# Patient Record
Sex: Male | Born: 1980
Health system: Southern US, Community
[De-identification: ages and names within clinical notes are randomized; demographics above are authoritative.]

## PROBLEM LIST (undated history)

## (undated) DIAGNOSIS — E785 Hyperlipidemia, unspecified: Secondary | ICD-10-CM

## (undated) DIAGNOSIS — I10 Essential (primary) hypertension: Secondary | ICD-10-CM

## (undated) DIAGNOSIS — E119 Type 2 diabetes mellitus without complications: Secondary | ICD-10-CM

## (undated) DIAGNOSIS — Z720 Tobacco use: Secondary | ICD-10-CM

## (undated) HISTORY — DX: Hyperlipidemia, unspecified: E78.5

## (undated) HISTORY — DX: Tobacco use: Z72.0

## (undated) HISTORY — DX: Type 2 diabetes mellitus without complications: E11.9

---

## 2015-07-21 ENCOUNTER — Encounter (HOSPITAL_COMMUNITY): Payer: Self-pay

## 2015-07-21 ENCOUNTER — Emergency Department (HOSPITAL_COMMUNITY)
Admission: EM | Admit: 2015-07-21 | Discharge: 2015-07-21 | Disposition: A | Discharge status: Home / Self Care | Payer: PRIVATE HEALTH INSURANCE | Attending: Emergency Medicine | Admitting: Emergency Medicine

## 2015-07-21 DIAGNOSIS — M5431 Sciatica, right side: Secondary | ICD-10-CM | POA: Diagnosis not present

## 2015-07-21 DIAGNOSIS — M545 Low back pain: Secondary | ICD-10-CM | POA: Diagnosis present

## 2015-07-21 DIAGNOSIS — I10 Essential (primary) hypertension: Secondary | ICD-10-CM | POA: Diagnosis not present

## 2015-07-21 HISTORY — DX: Essential (primary) hypertension: I10

## 2015-07-21 MED ORDER — METHOCARBAMOL 500 MG PO TABS
500.0000 mg | ORAL_TABLET | Freq: Two times a day (BID) | ORAL | Status: DC
Start: 2015-07-21 — End: 2015-10-02

## 2015-07-21 MED ORDER — METHYLPREDNISOLONE SODIUM SUCC 125 MG IJ SOLR
125.0000 mg | Freq: Once | INTRAMUSCULAR | Status: AC
  Administered 2015-07-21: 125 mg via INTRAMUSCULAR
  Filled 2015-07-21: qty 2

## 2015-07-21 MED ORDER — DICLOFENAC SODIUM 75 MG PO TBEC: 75.0000 mg | DELAYED_RELEASE_TABLET | Freq: Two times a day (BID) | ORAL | Status: DC

## 2015-07-21 MED ORDER — KETOROLAC TROMETHAMINE 60 MG/2ML IM SOLN
60.0000 mg | Freq: Once | INTRAMUSCULAR | Status: AC
  Administered 2015-07-21: 60 mg via INTRAMUSCULAR
  Filled 2015-07-21: qty 2

## 2015-07-21 MED ORDER — PREDNISONE 10 MG PO TABS: ORAL_TABLET | ORAL | Status: DC

## 2015-07-21 NOTE — ED Provider Notes (Signed)
CSN: [id number]     Arrival date & time 07/21/15  1038 History   First MD Initiated Contact with Patient 07/21/15 1120     Chief Complaint  Patient presents with  . Back Pain     (Consider location/radiation/quality/duration/timing/severity/associated sxs/prior Treatment) Patient is a 34 y.o. male presenting with back pain. The history is provided by the patient. No language interpreter was used.  Back Pain Location:  Lumbar spine Quality:  Aching Radiates to:  Does not radiate Pain severity:  Moderate Onset quality:  Gradual Duration:  3 days Timing:  Constant Progression:  Worsening Chronicity:  New Context: not recent illness   Relieved by:  None tried Worsened by:  Nothing tried Ineffective treatments:  None tried Associated symptoms: leg pain     Past Medical History  Diagnosis Date  . Hypertension    History reviewed. No pertinent past surgical history. History reviewed. No pertinent family history. Social History  Substance Use Topics  . Smoking status: Current Every Day Smoker  . Smokeless tobacco: None  . Alcohol Use: Yes     Comment: social    Review of Systems  Musculoskeletal: Positive for back pain.  All other systems reviewed and are negative.     Allergies  Review of patient's allergies indicates no known allergies.  Home Medications   Prior to Admission medications   Medication Sig Start Date End Date Taking? Authorizing Provider  diclofenac (VOLTAREN) 75 MG EC tablet Take 1 tablet (75 mg total) by mouth 2 (two) times daily. 07/21/15   Elson Areas, PA-C  methocarbamol (ROBAXIN) 500 MG tablet Take 1 tablet (500 mg total) by mouth 2 (two) times daily. 07/21/15   Elson Areas, PA-C  predniSONE (DELTASONE) 10 MG tablet 6,5,4,3,2,1 taper 07/21/15   Elson Areas, PA-C   BP 149/87 mmHg  Pulse 70  Temp(Src) 98.1 F (36.7 C) (Oral)  Resp 16  SpO2 100% Physical Exam  Constitutional: He is oriented to person, place, and time. He appears  well-developed and well-nourished.  HENT:  Head: Normocephalic.  Right Ear: External ear normal.  Left Ear: External ear normal.  Nose: Nose normal.  Mouth/Throat: Oropharynx is clear and moist.  Eyes: EOM are normal. Pupils are equal, round, and reactive to light.  Neck: Normal range of motion.  Cardiovascular: Normal rate, regular rhythm and normal heart sounds.   Pulmonary/Chest: Effort normal and breath sounds normal.  Abdominal: Soft. He exhibits no distension.  Musculoskeletal: Normal range of motion.  Neurological: He is alert and oriented to person, place, and time.  Skin: Skin is warm.  Psychiatric: He has a normal mood and affect.  Nursing note and vitals reviewed.   ED Course  Procedures (including critical care time) Labs Review Labs Reviewed - No data to display  Imaging Review No results found. I have personally reviewed and evaluated these images and lab results as part of my medical decision-making.   EKG Interpretation None      MDM  Pt given solumedrol and torodol   Final diagnoses:  Sciatica, right    voltaren Robaxin Prednisone Wellness referral and [facility]     Elson Areas, PA-C 07/21/15 1203  Nelva Nay, MD 07/30/15 [phone]

## 2015-07-21 NOTE — ED Notes (Signed)
Per pt, started having right lower back pain radiating down leg.  Does not recall injury.  Pain increased with position changes.  Difficulty sitting.  Tylenol not working

## 2015-07-21 NOTE — Discharge Instructions (Signed)
Sciatica Sciatica is pain, weakness, numbness, or tingling along the path of the sciatic nerve. The nerve starts in the lower back and runs down the back of each leg. The nerve controls the muscles in the lower leg and in the back of the knee, while also providing sensation to the back of the thigh, lower leg, and the sole of your foot. Sciatica is a symptom of another medical condition. For instance, nerve damage or certain conditions, such as a herniated disk or bone spur on the spine, pinch or put pressure on the sciatic nerve. This causes the pain, weakness, or other sensations normally associated with sciatica. Generally, sciatica only affects one side of the body. CAUSES   Herniated or slipped disc.  Degenerative disk disease.  A pain disorder involving the narrow muscle in the buttocks (piriformis syndrome).  Pelvic injury or fracture.  Pregnancy.  Tumor (rare). SYMPTOMS  Symptoms can vary from mild to very severe. The symptoms usually travel from the low back to the buttocks and down the back of the leg. Symptoms can include:  Mild tingling or dull aches in the lower back, leg, or hip.  Numbness in the back of the calf or sole of the foot.  Burning sensations in the lower back, leg, or hip.  Sharp pains in the lower back, leg, or hip.  Leg weakness.  Severe back pain inhibiting movement. These symptoms may get worse with coughing, sneezing, laughing, or prolonged sitting or standing. Also, being overweight may worsen symptoms. DIAGNOSIS  Your caregiver will perform a physical exam to look for common symptoms of sciatica. He or she may ask you to do certain movements or activities that would trigger sciatic nerve pain. Other tests may be performed to find the cause of the sciatica. These may include:  Blood tests.  X-rays.  Imaging tests, such as an MRI or CT scan. TREATMENT  Treatment is directed at the cause of the sciatic pain. Sometimes, treatment is not necessary  and the pain and discomfort goes away on its own. If treatment is needed, your caregiver may suggest:  Over-the-counter medicines to relieve pain.  Prescription medicines, such as anti-inflammatory medicine, muscle relaxants, or narcotics.  Applying heat or ice to the painful area.  Steroid injections to lessen pain, irritation, and inflammation around the nerve.  Reducing activity during periods of pain.  Exercising and stretching to strengthen your abdomen and improve flexibility of your spine. Your caregiver may suggest losing weight if the extra weight makes the back pain worse.  Physical therapy.  Surgery to eliminate what is pressing or pinching the nerve, such as a bone spur or part of a herniated disk. HOME CARE INSTRUCTIONS   Only take over-the-counter or prescription medicines for pain or discomfort as directed by your caregiver.  Apply ice to the affected area for 20 minutes, 3-4 times a day for the first 48-72 hours. Then try heat in the same way.  Exercise, stretch, or perform your usual activities if these do not aggravate your pain.  Attend physical therapy sessions as directed by your caregiver.  Keep all follow-up appointments as directed by your caregiver.  Do not wear high heels or shoes that do not provide proper support.  Check your mattress to see if it is too soft. A firm mattress may lessen your pain and discomfort. SEEK IMMEDIATE MEDICAL CARE IF:   You lose control of your bowel or bladder (incontinence).  You have increasing weakness in the lower back, pelvis, buttocks,   or legs.  You have redness or swelling of your back.  You have a burning sensation when you urinate.  You have pain that gets worse when you lie down or awakens you at night.  Your pain is worse than you have experienced in the past.  Your pain is lasting longer than 4 weeks.  You are suddenly losing weight without reason. MAKE SURE YOU:  Understand these  instructions.  Will watch your condition.  Will get help right away if you are not doing well or get worse. Document Released: 11/03/2001 Document Revised: 05/10/2012 Document Reviewed: 03/20/2012 [employer] Patient Information 2015 [employer]. This information is not intended to replace advice given to you by your health care provider. Make sure you discuss any questions you have with your health care provider.  

## 2015-10-01 DIAGNOSIS — Z720 Tobacco use: Secondary | ICD-10-CM | POA: Insufficient documentation

## 2015-10-01 DIAGNOSIS — G8929 Other chronic pain: Secondary | ICD-10-CM | POA: Insufficient documentation

## 2015-10-01 DIAGNOSIS — Z7952 Long term (current) use of systemic steroids: Secondary | ICD-10-CM | POA: Insufficient documentation

## 2015-10-01 DIAGNOSIS — M5431 Sciatica, right side: Secondary | ICD-10-CM | POA: Insufficient documentation

## 2015-10-01 DIAGNOSIS — I10 Essential (primary) hypertension: Secondary | ICD-10-CM | POA: Insufficient documentation

## 2015-10-02 ENCOUNTER — Emergency Department (HOSPITAL_COMMUNITY)
Admission: EM | Admit: 2015-10-02 | Discharge: 2015-10-02 | Disposition: A | Discharge status: Home / Self Care | Payer: PRIVATE HEALTH INSURANCE | Attending: Emergency Medicine | Admitting: Emergency Medicine

## 2015-10-02 ENCOUNTER — Encounter (HOSPITAL_COMMUNITY): Payer: Self-pay | Admitting: Emergency Medicine

## 2015-10-02 DIAGNOSIS — M5431 Sciatica, right side: Secondary | ICD-10-CM

## 2015-10-02 MED ORDER — TRAMADOL HCL 50 MG PO TABS: 50.0000 mg | ORAL_TABLET | Freq: Four times a day (QID) | ORAL | Status: DC | PRN

## 2015-10-02 MED ORDER — KETOROLAC TROMETHAMINE 60 MG/2ML IM SOLN
60.0000 mg | Freq: Once | INTRAMUSCULAR | Status: AC
  Administered 2015-10-02: 60 mg via INTRAMUSCULAR
  Filled 2015-10-02: qty 2

## 2015-10-02 MED ORDER — DICLOFENAC SODIUM 75 MG PO TBEC: 75.0000 mg | DELAYED_RELEASE_TABLET | Freq: Two times a day (BID) | ORAL | Status: DC

## 2015-10-02 MED ORDER — METHOCARBAMOL 500 MG PO TABS: 500.0000 mg | ORAL_TABLET | Freq: Two times a day (BID) | ORAL | Status: DC

## 2015-10-02 NOTE — ED Provider Notes (Signed)
CSN: [id number]     Arrival date & time 10/01/15  2353 History   First MD Initiated Contact with Patient 10/02/15 0015     Chief Complaint  Patient presents with  . Back Pain     (Consider location/radiation/quality/duration/timing/severity/associated sxs/prior Treatment) HPI   Craig Rowland is a 34 y.o. male  PCP: No PCP Per Patient  Blood pressure 141/93, pulse 73, temperature 97.8 F (36.6 C), temperature source Oral, resp. rate 16, SpO2 99 %.  SIGNIFICANT PMH: hypertension and sciatica CHIEF COMPLAINT: low back pain and sciatica  When: it has been bothering him off and on for a long time but the past 4 days it has been much more severe on the right side only. Mechanism: he works for [employer] but denies a specific inciting factor Chronicity: chronic Location: right low back and hip Radiation: down towards his right thigh Quality and severity: sharp, aching, pressure Treatments tried: OTC medications Alleviating factors: None Worsening factors: None Associated Symptoms: stiffness of the muscles and pain, some improvements with stretching Risk Factors: lifting and bending, on his feet a lot at [employer].  Negative ROS: Confusion, diaphoresis, fever, headache, weakness (general or focal), change of vision,  neck pain, dysphagia, aphagia, chest pain, shortness of breath,  , abdominal pains, nausea, vomiting, diarrhea, lower extremity swelling, rash.   Past Medical History  Diagnosis Date  . Hypertension    History reviewed. No pertinent past surgical history. History reviewed. No pertinent family history. Social History  Substance Use Topics  . Smoking status: Current Every Day Smoker  . Smokeless tobacco: None  . Alcohol Use: Yes     Comment: social    Review of Systems  ROS: See HPI Constitutional: no fever  Eyes: no drainage  ENT: no runny nose  Cardiovascular: no chest pain  Resp: no SOB  GI: no vomiting GU: no dysuria Integumentary: no rash  Allergy: no  hives  Musculoskeletal: no leg swelling  Neurological: no slurred speech ROS otherwise negative   Allergies  Review of patient's allergies indicates no known allergies.  Home Medications   Prior to Admission medications   Medication Sig Start Date End Date Taking? Authorizing Provider  diclofenac (VOLTAREN) 75 MG EC tablet Take 1 tablet (75 mg total) by mouth 2 (two) times daily. 10/02/15   May Manrique Neva Seat, PA-C  methocarbamol (ROBAXIN) 500 MG tablet Take 1 tablet (500 mg total) by mouth 2 (two) times daily. 10/02/15   Chrysa Rampy Neva Seat, PA-C  predniSONE (DELTASONE) 10 MG tablet 6,5,4,3,2,1 taper 07/21/15   Elson Areas, PA-C  traMADol (ULTRAM) 50 MG tablet Take 1 tablet (50 mg total) by mouth every 6 (six) hours as needed. 10/02/15   Adlee Paar Neva Seat, PA-C   BP 141/93 mmHg  Pulse 73  Temp(Src) 97.8 F (36.6 C) (Oral)  Resp 16  SpO2 99% Physical Exam  Constitutional: He appears well-developed and well-nourished. No distress.  HENT:  Head: Normocephalic and atraumatic.  Eyes: Pupils are equal, round, and reactive to light.  Neck: Normal range of motion. Neck supple.  Cardiovascular: Normal rate and regular rhythm.   Pulmonary/Chest: Effort normal.  Abdominal: Soft.  Musculoskeletal:  Symmetrical and physiologic strength to bilateral lower extremities.  Neurosensory function adequate to both legs Skin color is normal. Skin is warm and moist.  No step off deformity appreciated and no midline bony tenderness.  Can ambulate with some discomfort.  No crepitus, laceration, effusion, induration, lesions Pedal pulses are symmetrical and palpable bilaterally  + tenderness to right hip and  paraspinal lumbar muscle on palpation  Neurological: He is alert.  Skin: Skin is warm and dry.  Nursing note and vitals reviewed.   ED Course  Procedures (including critical care time) Labs Review Labs Reviewed - No data to display  Imaging Review No results found. I have personally reviewed  and evaluated these images and lab results as part of my medical decision-making.   EKG Interpretation None      MDM   Final diagnoses:  Sciatica of right side    34 y.o.Craig Rowland's  with back pain.   No neurological deficits and normal neuro exam. No loss of bowel or bladder control. No concern for cauda equina at this time base on HPI and physical exam findings. No fever, night sweats, weight loss, h/o cancer, IVDU. The patient can walk with some discomfort.   Patient Plan 1. Medications: NSAIDs and/or muscle relaxer. Cont usual home medications unless otherwise directed. 2. Treatment: rest, drink plenty of fluids, gentle stretching as discussed, alternate ice and heat  3. Follow Up: Please followup with your primary doctor for discussion of your diagnoses and further evaluation after today's visit; if you do not have a primary care doctor use the resource guide provided to find one  Advised to follow-up with the orthopedist if symptoms do not start to resolve in the next 2-3 days. If develop loss of bowel or urinary control return to the ED as soon as possible for further evaluation. To take the medications as prescribed as they can cause harm if not taken appropriately.   Vital signs are stable at discharge. Filed Vitals:   10/02/15 0014  BP: 141/93  Pulse: 73  Temp: 97.8 F (36.6 C)  Resp: 16    Patient/guardian has voiced understanding and agreed to follow-up with the PCP or specialist.         Marlon Peliffany Eula Jaster, PA-C 10/02/15 0445  Gilda Creasehristopher J Pollina, MD 10/02/15 [phone]

## 2015-10-02 NOTE — ED Notes (Signed)
Bed: WA04 Expected date:  Expected time:  Means of arrival:  Comments: 

## 2015-10-02 NOTE — ED Notes (Signed)
Pt states he has a hx of sciatica and now has R sided lower back pain that feels similar. Alert and oriented.

## 2015-10-02 NOTE — Discharge Instructions (Signed)

## 2016-01-07 ENCOUNTER — Emergency Department (HOSPITAL_COMMUNITY)
Admission: EM | Admit: 2016-01-07 | Discharge: 2016-01-07 | Disposition: A | Discharge status: Home / Self Care | Payer: PRIVATE HEALTH INSURANCE | Attending: Physician Assistant | Admitting: Physician Assistant

## 2016-01-07 ENCOUNTER — Encounter (HOSPITAL_COMMUNITY): Payer: Self-pay

## 2016-01-07 DIAGNOSIS — R05 Cough: Secondary | ICD-10-CM | POA: Insufficient documentation

## 2016-01-07 DIAGNOSIS — Y9389 Activity, other specified: Secondary | ICD-10-CM | POA: Insufficient documentation

## 2016-01-07 DIAGNOSIS — T148 Other injury of unspecified body region: Secondary | ICD-10-CM | POA: Insufficient documentation

## 2016-01-07 DIAGNOSIS — I10 Essential (primary) hypertension: Secondary | ICD-10-CM | POA: Insufficient documentation

## 2016-01-07 DIAGNOSIS — M545 Low back pain: Secondary | ICD-10-CM | POA: Insufficient documentation

## 2016-01-07 DIAGNOSIS — X58XXXA Exposure to other specified factors, initial encounter: Secondary | ICD-10-CM | POA: Insufficient documentation

## 2016-01-07 DIAGNOSIS — F17210 Nicotine dependence, cigarettes, uncomplicated: Secondary | ICD-10-CM | POA: Insufficient documentation

## 2016-01-07 DIAGNOSIS — R197 Diarrhea, unspecified: Secondary | ICD-10-CM | POA: Insufficient documentation

## 2016-01-07 DIAGNOSIS — Y998 Other external cause status: Secondary | ICD-10-CM | POA: Insufficient documentation

## 2016-01-07 DIAGNOSIS — G8929 Other chronic pain: Secondary | ICD-10-CM | POA: Insufficient documentation

## 2016-01-07 DIAGNOSIS — T148XXA Other injury of unspecified body region, initial encounter: Secondary | ICD-10-CM

## 2016-01-07 DIAGNOSIS — Y9289 Other specified places as the place of occurrence of the external cause: Secondary | ICD-10-CM | POA: Insufficient documentation

## 2016-01-07 LAB — URINALYSIS, ROUTINE W REFLEX MICROSCOPIC
BILIRUBIN URINE: NEGATIVE
GLUCOSE, UA: NEGATIVE mg/dL
Hgb urine dipstick: NEGATIVE
Ketones, ur: NEGATIVE mg/dL
Leukocytes, UA: NEGATIVE
NITRITE: NEGATIVE
PH: 5 (ref 5.0–8.0)
Protein, ur: NEGATIVE mg/dL
SPECIFIC GRAVITY, URINE: 1.027 (ref 1.005–1.030)

## 2016-01-07 LAB — CBC WITH DIFFERENTIAL/PLATELET
Basophils Absolute: 0 10*3/uL (ref 0.0–0.1)
Basophils Relative: 0 %
Eosinophils Absolute: 0.5 10*3/uL (ref 0.0–0.7)
Eosinophils Relative: 5 %
HEMATOCRIT: 41.1 % (ref 39.0–52.0)
Hemoglobin: 14.2 g/dL (ref 13.0–17.0)
LYMPHS ABS: 3.7 10*3/uL (ref 0.7–4.0)
Lymphocytes Relative: 35 %
MCH: 31.8 pg (ref 26.0–34.0)
MCHC: 34.5 g/dL (ref 30.0–36.0)
MCV: 92.2 fL (ref 78.0–100.0)
MONO ABS: 0.6 10*3/uL (ref 0.1–1.0)
MONOS PCT: 5 %
NEUTROS PCT: 55 %
Neutro Abs: 5.9 10*3/uL (ref 1.7–7.7)
Platelets: 327 10*3/uL (ref 150–400)
RBC: 4.46 MIL/uL (ref 4.22–5.81)
RDW: 12.3 % (ref 11.5–15.5)
WBC: 10.7 10*3/uL — ABNORMAL HIGH (ref 4.0–10.5)

## 2016-01-07 LAB — BASIC METABOLIC PANEL
Anion gap: 13 (ref 5–15)
BUN: 14 mg/dL (ref 6–20)
CHLORIDE: 101 mmol/L (ref 101–111)
CO2: 26 mmol/L (ref 22–32)
Calcium: 9.7 mg/dL (ref 8.9–10.3)
Creatinine, Ser: 0.9 mg/dL (ref 0.61–1.24)
GFR calc Af Amer: >60 mL/min (ref >60)
GFR calc non Af Amer: >60 mL/min (ref >60)
Glucose, Bld: 113 mg/dL — ABNORMAL HIGH (ref 65–99)
Potassium: 3.7 mmol/L (ref 3.5–5.1)
Sodium: 140 mmol/L (ref 135–145)

## 2016-01-07 MED ORDER — CYCLOBENZAPRINE HCL 10 MG PO TABS: 10.0000 mg | ORAL_TABLET | Freq: Two times a day (BID) | ORAL | Status: DC | PRN

## 2016-01-07 MED ORDER — NAPROXEN 500 MG PO TABS: 500.0000 mg | ORAL_TABLET | Freq: Two times a day (BID) | ORAL | Status: DC

## 2016-01-07 MED ORDER — KETOROLAC TROMETHAMINE 30 MG/ML IJ SOLN
30.0000 mg | Freq: Once | INTRAMUSCULAR | Status: AC
  Administered 2016-01-07: 30 mg via INTRAVENOUS
  Filled 2016-01-07: qty 1

## 2016-01-07 NOTE — ED Notes (Signed)
Pt c/o increasing R flank pain x 1 week pain.  Pain score 9/10.  Pt has not taken anything for pain.  Denies n/v/d.  Denies GU complaints.

## 2016-01-07 NOTE — Discharge Instructions (Signed)
Take your medications as prescribed. I also recommend applying ice to affected area for 15-20 minutes 3-4 times daily as needed for pain relief. Refrain from doing any heavy lifting, excessive exercising or movements that exacerbate your symptoms for the next few days. Please follow up with a primary care provider from the Resource Guide provided below in 5 days. Please return to the Emergency Department if symptoms worsen or new onset of fever, numbness, tingling, groin anesthesia, loss of bowel or bladder, weakness, abdominal pain, vomiting, pain with urination, blood in urine.    Emergency Department Resource Guide 1) Find a Doctor and Pay Out of Pocket Although you won't have to find out who is covered by your insurance plan, it is a good idea to ask around and get recommendations. You will then need to call the office and see if the doctor you have chosen will accept you as a new patient and what types of options they offer for patients who are self-pay. Some doctors offer discounts or will set up payment plans for their patients who do not have insurance, but you will need to ask so you aren't surprised when you get to your appointment.  2) Contact Your Local Health Department Not all health departments have doctors that can see patients for sick visits, but many do, so it is worth a call to see if yours does. If you don't know where your local health department is, you can check in your phone book. The CDC also has a tool to help you locate your state's health department, and many state websites also have listings of all of their local health departments.  3) Find a Walk-in Clinic If your illness is not likely to be very severe or complicated, you may want to try a walk in clinic. These are popping up all over the country in pharmacies, drugstores, and shopping centers. They're usually staffed by nurse practitioners or physician assistants that have been trained to treat common illnesses and  complaints. They're usually fairly quick and inexpensive. However, if you have serious medical issues or chronic medical problems, these are probably not your best option.  No Primary Care Doctor: - Call Health Connect at  [phone] - they can help you locate a primary care doctor that  accepts your insurance, provides certain services, etc. - Physician Referral Service- [phone]  Chronic Pain Problems: Organization         Address  Phone   Notes  [facility]  [phone] Patients need to be referred by their primary care doctor.   Medication Assistance: Organization         Address  Phone   Notes  [facility] Medication [facility] [address] [place] [place], [place] [address] [phone] --Must be a resident of [facility] -- Must have NO insurance coverage whatsoever (no Medicaid/ Medicare, etc.) -- The pt. MUST have a primary care doctor that directs their care regularly and follows them in the community   Med[facility]ist  [phone]   [employer]  [phone]    Agencies that provide inexpensive medical care: Organization         Address  Phone   Notes  [facility]  [phone]   [facility]    [phone]   [facility] [address] [place], [place] [address] [phone]   [facility] [address] [place]. [address], [place] [phone]  Planned Parenthood    [phone]   [facility]    [phone]   Community Health and [place] [address], [place] Phone:  [phone], Fax:  [phone] Hours of Operation:  9 am - 6 pm, M-F.  Also accepts Medicaid/Medicare and self-pay.  [facility] for [place], [address], [place] Phone: [phone], Fax: [phone]. Hours of Operation:  8:30 am - 5:30 pm, M-F.  Also accepts Medicaid and self-pay.  [facility] [address], [place] Phone: [phone]   [place], [place], [place] [phone], Ext. [phone] Mondays & Thursdays: 7-9 AM.  First 15 patients are seen on a first come, first serve basis.    [place] Providers:  Organization         Address  Phone   Notes  [facility] [address], [place] [phone] Also accepts self-pay patients.  [facility] [address], [place]  [phone]   [place], [address], [place] [phone]   [facility] [address] [place] [phone]   Lucianne Lei [address], [place]   [phone] Only accepts [place] Access [place] patients after they have their name applied to their card.   Self-Pay (no insurance) in [facility]:  Organization         Address  Phone   Notes  Sickle Cell Patients, [facility] [place] [phone]   [facility] [place] [phone]   [facility] [place]  [place], [place], [place] [phone]   [facility]/Dr. Osei-Bonsu  [address], [place] or [address], [place] [phone] Phone number for both [place] and [place] locations is the same.  Urgent Medical and [facility] [address], [place] [phone]   [facility] [address], [place] or [address] [phone]   [facility] [address] [place] [phone], phone; [phone], fax Sees patients 1st and 3rd Saturday of every month.  Must not qualify for public or private insurance (i.e. Medicaid, Medicare, [place] Health Choice, Veterans' Benefits)  Household income should be no more than 200% of the poverty level  The clinic cannot treat you if you are pregnant or think you are pregnant  Sexually transmitted diseases are not treated at the clinic.    Dental Care: Organization         Address  Phone  Notes  [facility] Department of [facility] [phone] Accepts children up to age 26 who are enrolled in [place] or [place]; pregnant women with a Medicaid card; and children who have applied for Medicaid or [place] Health Choice, but were declined, whose parents can pay a reduced fee at time of service.  [facility] Department of [place] [facility]  [address], [place] [phone] Accepts children up to age 44 who are enrolled in [place] or [place]; pregnant women with a Medicaid card; and children who have applied for Medicaid or [place], but were declined,  whose parents can pay a reduced fee at time of service.  [facility] Adult Dental Access PROGRAM  [place] [phone] Patients are seen by appointment only. Walk-ins are not accepted. [place] will see patients 21 years of age and older. Monday - Tuesday (8am-5pm) Most Wednesdays (8:30-5pm) $30 per visit, cash only  [facility] Adult Dental Access PROGRAM  [address], [facility] [phone] Patients are seen by appointment only. Walk-ins are not accepted. [place] will see patients 70 years of age and older. One Wednesday Evening (Monthly: Volunteer Based).  $30 per visit, cash only  [place]  [phone] for adults; Children under age 28, call Graduate Pediatric Dentistry at [phone]. Children aged 8-14, please call [phone] to request a pediatric application.  Dental services are provided in all areas of dental care including fillings, crowns and bridges, complete and partial dentures, implants, gum treatment, root canals, and extractions. Preventive care is  also provided. Treatment is provided to both adults and children. Patients are selected via a lottery and there is often a waiting list.   [facility] [address] [place]  [phone] [email or web address]   [facility] [address] [place], [place] [phone], Ext. [phone] Second and Fourth Thursday of each month, opens at 6:30 AM; Clinic ends at 9 AM.  Patients are seen on a first-come first-served basis, and a limited number are seen during each clinic.   [facility]  [address] Hillard Danker [place], [place] [phone]   Eligibility Requirements You must have lived in [place], [place], or [place] counties for at least the last three months.   You cannot be eligible for state or federal sponsored [employer], including [employer], [place], or [employer].   You generally cannot be eligible for healthcare insurance through your employer.    How to apply: Eligibility screenings are held every Tuesday and Wednesday afternoon from 1:00 pm until 4:00 pm. You do not need an appointment for the interview!  [facility] [address], [place], [place]   [place]  [place] Department  [place]  [phone]    Behavioral Health Resources in the Community: Intensive Outpatient Programs Organization         Address  Phone  Notes  [place]. [address], [place], [place] [phone]   [facility] Outpatient [address] [place], [place]   ADS: Alcohol & Drug Svcs [address] [place], [place]   [place] [address]  [place], [place] or [phone]   Substance Abuse Resources Organization         Address  Phone  Notes  Alcohol and Drug Services  [phone]   [place]  [phone]   [facility] [place]   Chinita Pester  [phone]   Residential & Outpatient Substance Abuse Program  [phone]   Psychological Services Organization         Address  Phone  Notes  [facility] [place]  [place]  [phone]   [place] [address] [place] or [phone]    Mobile Crisis Teams Organization         Address  Phone  Notes  [facility] Alternatives, Mobile  Crisis Care Unit  [phone]   Assertive Psychotherapeutic Services  [address] [place], [place]   [facility] [address] [place] [phone]    Self-Help/Support Groups Organization         Address  Phone             Notes  [place]. of [place] - variety of support groups  [place] Call for more information  Narcotics Anonymous (NA), Caring Services [address], [employer] [place]  2 meetings at this location   Special educational needs teacher         Address  Phone  Notes  ASAP Residential Treatment [place],    [place]  [phone]   [facility]  [address] [place] [id number], [place], [place]   [place], [place] [phone] Admissions: 8am-3pm M-F  Incentives Substance [place] [address],    [place], [place] [phone]   [facility] [address] [place], [place], [place]   [facility] [address],  [place], [place]   Insight Programs - Intensive Outpatient [address], [place], [place]   [facility] ([place].) [place].,  [place], [place] [phone] or [phone]   [facility]idential Treatment Services (RTS) [address], [place], [place] Accepts Medicaid  [facility] [place] [address],  [place] [phone]  Substance Abuse/Addiction Treatment   [facility] Organization         Address  Phone  Notes  [facility]  [phone]   Domenic Schwab, PhD [address] [place], [place]   [phone] or [phone]   [facility] [place], [place] [phone]   [facility] [address] [place], [place] [phone] Insurance/Medicaid/sponsorship through [facility] and Families [address] [place]                                    [place], [place] [phone] [place] [address], [place] [phone]    Dr. Adele Schilder  [phone]   [facility] Dept. 1) [address], [place] 2) [place] 3)  [place] [address], [place] [phone]  [phone]   [place] [phone] or [phone] (After Hours)

## 2016-01-07 NOTE — ED Provider Notes (Signed)
CSN: [id number]     Arrival date & time 01/07/16  4098 History   First MD Initiated Contact with Patient 01/07/16 1501     Chief Complaint  Patient presents with  . Flank Pain     (Consider location/radiation/quality/duration/timing/severity/associated sxs/prior Treatment) HPI   This is a 35 year old male past medical history of hypertension who presents to the ED with complaint of right side pain, onset one week. Patient reports having a dry cough and mild congestion over the past 1-2 weeks. He notes having multiple coughing attacks over the past few weeks. Pt reports while he was coughing last week he began to have sharp right flank/side pain. He states he feels like he "pulled something". He notes the pain is worse with coughing, movement or deep breathing. Denies taking any medications at home. Denies fever, chills, headache, shortness of breath, productive cough, wheezing, hemoptysis, chest pain, abdominal pain, nausea, vomiting, diarrhea, urinary symptoms, blood in urine or stool, numbness, tingling, weakness, saddle anesthesia. Pt denies loss of bowel or bladder, weakness, IVDU, cancer or recent spinal manipulation. He endorses having chronic lower back pain but reports that this pain feels different and is located in a different region.  Past Medical History  Diagnosis Date  . Hypertension    History reviewed. No pertinent past surgical history. History reviewed. No pertinent family history. Social History  Substance Use Topics  . Smoking status: Current Every Day Smoker -- 0.50 packs/day    Types: Cigarettes  . Smokeless tobacco: None  . Alcohol Use: Yes     Comment: social    Review of Systems  HENT: Positive for congestion.   Respiratory: Positive for cough.   Musculoskeletal: Positive for myalgias (right flank/side).  All other systems reviewed and are negative.     Allergies  Review of patient's allergies indicates no known allergies.  Home Medications   Prior  to Admission medications   Medication Sig Start Date End Date Taking? Authorizing Provider  ibuprofen (ADVIL,MOTRIN) 200 MG tablet Take 1,000 mg by mouth every 8 (eight) hours as needed for headache, mild pain or moderate pain.    Yes Historical Provider, MD  cyclobenzaprine (FLEXERIL) 10 MG tablet Take 1 tablet (10 mg total) by mouth 2 (two) times daily as needed for muscle spasms. 01/07/16   Barrett Henle, PA-C  diclofenac (VOLTAREN) 75 MG EC tablet Take 1 tablet (75 mg total) by mouth 2 (two) times daily. Patient not taking: Reported on 01/07/2016 10/02/15   Marlon Pel, PA-C  methocarbamol (ROBAXIN) 500 MG tablet Take 1 tablet (500 mg total) by mouth 2 (two) times daily. Patient not taking: Reported on 01/07/2016 10/02/15   Marlon Pel, PA-C  naproxen (NAPROSYN) 500 MG tablet Take 1 tablet (500 mg total) by mouth 2 (two) times daily. 01/07/16   Barrett Henle, PA-C  predniSONE (DELTASONE) 10 MG tablet [phone] taper Patient not taking: Reported on 01/07/2016 07/21/15   Elson Areas, PA-C  traMADol (ULTRAM) 50 MG tablet Take 1 tablet (50 mg total) by mouth every 6 (six) hours as needed. Patient not taking: Reported on 01/07/2016 10/02/15   Marlon Pel, PA-C   BP 145/94 mmHg  Pulse 59  Temp(Src) 98.1 F (36.7 C) (Oral)  Resp 18  Ht 6' (1.829 m)  Wt 133.811 kg  BMI 40.00 kg/m2  SpO2 100% Physical Exam  Constitutional: He is oriented to person, place, and time. He appears well-developed and well-nourished. No distress.  HENT:  Head: Normocephalic and atraumatic.  Right Ear:  Tympanic membrane normal.  Left Ear: Tympanic membrane normal.  Nose: Nose normal. Right sinus exhibits no maxillary sinus tenderness and no frontal sinus tenderness. Left sinus exhibits no maxillary sinus tenderness and no frontal sinus tenderness.  Mouth/Throat: Uvula is midline and mucous membranes are normal. Posterior oropharyngeal erythema present. No oropharyngeal exudate, posterior  oropharyngeal edema or tonsillar abscesses.  Eyes: Conjunctivae and EOM are normal. Pupils are equal, round, and reactive to light. Right eye exhibits no discharge. Left eye exhibits no discharge. No scleral icterus.  Neck: Normal range of motion. Neck supple.  Cardiovascular: Normal rate, regular rhythm, normal heart sounds and intact distal pulses.   Pulmonary/Chest: Effort normal and breath sounds normal. No respiratory distress. He has no wheezes. He has no rales. He exhibits no tenderness.  Abdominal: Soft. Bowel sounds are normal. He exhibits no distension and no mass. There is no tenderness. There is no rebound, no guarding and no CVA tenderness.  Mild TTP over right lateral flank inferior to ribs along obliques.  Musculoskeletal: Normal range of motion. He exhibits no edema or tenderness.  FROM of BUE and BLE with 5/5 strength. Sensation intact. Cap refill <2. 2+ radial and PT pulses.   Lymphadenopathy:    He has no cervical adenopathy.  Neurological: He is alert and oriented to person, place, and time. He has normal strength and normal reflexes. No sensory deficit. Gait normal.  Skin: Skin is warm and dry. He is not diaphoretic.  Nursing note and vitals reviewed.   ED Course  Procedures (including critical care time) Labs Review Labs Reviewed  CBC WITH DIFFERENTIAL/PLATELET - Abnormal; Notable for the following:    WBC 10.7 (*)    All other components within normal limits  BASIC METABOLIC PANEL - Abnormal; Notable for the following:    Glucose, Bld 113 (*)    All other components within normal limits  URINALYSIS, ROUTINE W REFLEX MICROSCOPIC (NOT AT [facility])    Imaging Review No results found. I have personally reviewed and evaluated these images and lab results as part of my medical decision-making.  Filed Vitals:   01/07/16 1450 01/07/16 1709  BP: 138/87 145/94  Pulse: 65 59  Temp: 98.1 F (36.7 C)   Resp: 16 18     MDM   Final diagnoses:  Muscle strain    Patient presents with right flank/side pain for the past week, worse with coughing, bending or movement. He also reports having URI symptoms for the past 2 weeks with associated nonproductive cough. No back pain red flags. VSS. Exam revealed mild tenderness over right lateral flank along obliques. No neuro deficits. Abdominal exam benign. Pt given IV toradol. Labs and urine unremarkable. On reevaluation patient reports his pain has improved. I suspect pt's symptoms at likely due to muscle strain associated recent cough associated with URI. I do not suspect any acute spinal or abdominal etiology at this time and do not feel that any further workup or imaging is warranted at this time. Plan to discharge patient home with NSAIDs and muscle relaxant, discussed symptomatic treatment. Patient given resource guide to follow up with PCP.  Evaluation does not show pathology requring ongoing emergent intervention or admission. Pt is hemodynamically stable and mentating appropriately. Discussed findings/results and plan with patient/guardian, who agrees with plan. All questions answered. Return precautions discussed and outpatient follow up given.      Satira Sark [place], [place] 01/07/16 1722  Courteney Randall An, MD 01/07/16 2237

## 2016-01-07 NOTE — Progress Notes (Addendum)
Pt confirms with ED CM he has an orange card and made an appt to see a [facility] MD but can not recall MD name  CM spoke with pt who confirms uninsured [employer] resident with no pcp.  CM discussed and provided written information for uninsured accepting pcps, discussed the importance of pcp vs EDP services for f/u care, www.needymeds.org, www.goodrx.com, discounted pharmacies and other [employer] such as [employer] , [employer], affordable care act, financial assistance, uninsured dental services, [place] med assist, DSS and  health department  Reviewed resources for [employer] uninsured accepting pcps like [facility]ovita Kussmaul, family medicine at [employer], [facility], [facility], local urgent care centers, [facility], [facility] family practice, general medical clinics, family services of the [place], [facility] urgent care plus others, medication resources, [facility] out patient pharmacies and housing Pt voiced understanding and appreciation of resources provided   Entered in d/c instructions  Please use the resources provided to you in emergency room by case manager to assist with doctor for follow up These [employer] uninsured resources provide possible primary care providers, resources for discounted medications, housing, dental resources, affordable care act information, plus other resources for [employer]

## 2016-02-06 ENCOUNTER — Encounter (HOSPITAL_COMMUNITY): Payer: Self-pay

## 2016-02-06 ENCOUNTER — Emergency Department (HOSPITAL_COMMUNITY)
Admission: EM | Admit: 2016-02-06 | Discharge: 2016-02-06 | Disposition: A | Discharge status: Home / Self Care | Payer: MEDICAID | Attending: Emergency Medicine | Admitting: Emergency Medicine

## 2016-02-06 DIAGNOSIS — J039 Acute tonsillitis, unspecified: Secondary | ICD-10-CM | POA: Insufficient documentation

## 2016-02-06 DIAGNOSIS — I10 Essential (primary) hypertension: Secondary | ICD-10-CM | POA: Insufficient documentation

## 2016-02-06 DIAGNOSIS — Z791 Long term (current) use of non-steroidal anti-inflammatories (NSAID): Secondary | ICD-10-CM | POA: Insufficient documentation

## 2016-02-06 DIAGNOSIS — Z79899 Other long term (current) drug therapy: Secondary | ICD-10-CM | POA: Insufficient documentation

## 2016-02-06 DIAGNOSIS — F17210 Nicotine dependence, cigarettes, uncomplicated: Secondary | ICD-10-CM | POA: Insufficient documentation

## 2016-02-06 LAB — CBC WITH DIFFERENTIAL/PLATELET
BASOS ABS: 0 10*3/uL (ref 0.0–0.1)
BASOS PCT: 0 %
EOS ABS: 0.1 10*3/uL (ref 0.0–0.7)
EOS PCT: 1 %
HCT: 41.1 % (ref 39.0–52.0)
Hemoglobin: 14.3 g/dL (ref 13.0–17.0)
Lymphocytes Relative: 15 %
Lymphs Abs: 2.9 10*3/uL (ref 0.7–4.0)
MCH: 32.2 pg (ref 26.0–34.0)
MCHC: 34.8 g/dL (ref 30.0–36.0)
MCV: 92.6 fL (ref 78.0–100.0)
Monocytes Absolute: 1.2 10*3/uL — ABNORMAL HIGH (ref 0.1–1.0)
Monocytes Relative: 6 %
Neutro Abs: 14.3 10*3/uL — ABNORMAL HIGH (ref 1.7–7.7)
Neutrophils Relative %: 78 %
PLATELETS: 355 10*3/uL (ref 150–400)
RBC: 4.44 MIL/uL (ref 4.22–5.81)
RDW: 12.4 % (ref 11.5–15.5)
WBC: 18.5 10*3/uL — AB (ref 4.0–10.5)

## 2016-02-06 LAB — BASIC METABOLIC PANEL
ANION GAP: 11 (ref 5–15)
BUN: 9 mg/dL (ref 6–20)
CO2: 23 mmol/L (ref 22–32)
Calcium: 9.5 mg/dL (ref 8.9–10.3)
Chloride: 103 mmol/L (ref 101–111)
Creatinine, Ser: 0.87 mg/dL (ref 0.61–1.24)
GFR calc Af Amer: >60 mL/min (ref >60)
Glucose, Bld: 165 mg/dL — ABNORMAL HIGH (ref 65–99)
POTASSIUM: 3.8 mmol/L (ref 3.5–5.1)
SODIUM: 137 mmol/L (ref 135–145)

## 2016-02-06 LAB — RAPID STREP SCREEN (MED CTR MEBANE ONLY): STREPTOCOCCUS, GROUP A SCREEN (DIRECT): NEGATIVE

## 2016-02-06 MED ORDER — SODIUM CHLORIDE 0.9 % IV BOLUS (SEPSIS)
1000.0000 mL | Freq: Once | INTRAVENOUS | Status: AC
  Administered 2016-02-06: 1000 mL via INTRAVENOUS

## 2016-02-06 MED ORDER — LIDOCAINE VISCOUS 2 % MT SOLN
15.0000 mL | Freq: Once | OROMUCOSAL | Status: AC
  Administered 2016-02-06: 15 mL via OROMUCOSAL
  Filled 2016-02-06: qty 15

## 2016-02-06 MED ORDER — HYDROCODONE-HOMATROPINE 5-1.5 MG/5ML PO SYRP: 5.0000 mL | ORAL_SOLUTION | Freq: Four times a day (QID) | ORAL | Status: DC | PRN

## 2016-02-06 MED ORDER — ACETAMINOPHEN 325 MG PO TABS
650.0000 mg | ORAL_TABLET | Freq: Once | ORAL | Status: DC
  Filled 2016-02-06: qty 2

## 2016-02-06 MED ORDER — KETOROLAC TROMETHAMINE 30 MG/ML IJ SOLN
30.0000 mg | Freq: Once | INTRAMUSCULAR | Status: AC
  Administered 2016-02-06: 30 mg via INTRAVENOUS
  Filled 2016-02-06: qty 1

## 2016-02-06 MED ORDER — DEXAMETHASONE SODIUM PHOSPHATE 10 MG/ML IJ SOLN
10.0000 mg | Freq: Once | INTRAMUSCULAR | Status: AC
  Administered 2016-02-06: 10 mg via INTRAVENOUS
  Filled 2016-02-06: qty 1

## 2016-02-06 MED ORDER — CLINDAMYCIN PHOSPHATE 900 MG/50ML IV SOLN
900.0000 mg | Freq: Once | INTRAVENOUS | Status: AC
  Administered 2016-02-06: 900 mg via INTRAVENOUS
  Filled 2016-02-06: qty 50

## 2016-02-06 MED ORDER — CLINDAMYCIN HCL 300 MG PO CAPS: 300.0000 mg | ORAL_CAPSULE | Freq: Three times a day (TID) | ORAL | Status: DC

## 2016-02-06 NOTE — ED Provider Notes (Signed)
CSN: [id number]     Arrival date & time 02/06/16  [id number] History   First MD Initiated Contact with Patient 02/06/16 [phone]     Chief Complaint  Patient presents with  . Sore Throat     (Consider location/radiation/quality/duration/timing/severity/associated sxs/prior Treatment) HPI  Craig Rowland is a 35 y.o. male with PMH significant for HTN who presents with 2-3 days of gradual onset, constant, severe, worsening sore throat and swelling (L side > R).  Associated symptoms include fever (Tmax 101), chills, left anterior neck pain, and fatigue.  Denies choking, difficulty breathing, SOB, CP, myalgias, neck stiffness, N/V, abdominal pain, or urinary symptoms.  He states has has not been able to tolerate PO intake, and has been sucking on ice.  He states he has occasionally had to use a "spit cup" because it hurts so much to swallow. Tylenol tried PTA which alleviated his fever.   Past Medical History  Diagnosis Date  . Hypertension    History reviewed. No pertinent past surgical history. No family history on file. Social History  Substance Use Topics  . Smoking status: Current Every Day Smoker -- 0.50 packs/day    Types: Cigarettes  . Smokeless tobacco: None  . Alcohol Use: Yes     Comment: social    Review of Systems All other systems negative unless otherwise stated in HPI    Allergies  Review of patient's allergies indicates no known allergies.  Home Medications   Prior to Admission medications   Medication Sig Start Date End Date Taking? Authorizing Provider  ibuprofen (ADVIL,MOTRIN) 200 MG tablet Take 1,000 mg by mouth 2 (two) times daily as needed for headache, mild pain or moderate pain.    Yes Historical Provider, MD  Multiple Vitamin (MULTIVITAMIN WITH MINERALS) TABS tablet Take 1 tablet by mouth daily.   Yes Historical Provider, MD  vitamin C (ASCORBIC ACID) 500 MG tablet Take 500 mg by mouth daily as needed (cold symptoms).   Yes Historical Provider, MD   cyclobenzaprine (FLEXERIL) 10 MG tablet Take 1 tablet (10 mg total) by mouth 2 (two) times daily as needed for muscle spasms. 01/07/16   Barrett HenleNicole Elizabeth Nadeau, PA-C  diclofenac (VOLTAREN) 75 MG EC tablet Take 1 tablet (75 mg total) by mouth 2 (two) times daily. Patient not taking: Reported on 01/07/2016 10/02/15   Marlon Peliffany Greene, PA-C  methocarbamol (ROBAXIN) 500 MG tablet Take 1 tablet (500 mg total) by mouth 2 (two) times daily. Patient not taking: Reported on 01/07/2016 10/02/15   Marlon Peliffany Greene, PA-C  naproxen (NAPROSYN) 500 MG tablet Take 1 tablet (500 mg total) by mouth 2 (two) times daily. 01/07/16   Barrett HenleNicole Elizabeth Nadeau, PA-C  predniSONE (DELTASONE) 10 MG tablet 6,5,4,3,2,1 taper 07/21/15   Elson AreasLeslie K Sofia, PA-C  traMADol (ULTRAM) 50 MG tablet Take 1 tablet (50 mg total) by mouth every 6 (six) hours as needed. 10/02/15   Tiffany Neva SeatGreene, PA-C   BP 147/100 mmHg  Pulse 105  Temp(Src) 97.4 F (36.3 C) (Oral)  Resp 17  SpO2 95% Physical Exam  Constitutional: He is oriented to person, place, and time. He appears well-developed and well-nourished.  Non-toxic appearance. He does not have a sickly appearance. He does not appear ill.  HENT:  Head: Normocephalic and atraumatic.  Right Ear: Tympanic membrane and external ear normal.  Left Ear: Tympanic membrane and external ear normal.  Nose: Nose normal.  Mouth/Throat: No trismus in the jaw. No uvula swelling. Posterior oropharyngeal edema (left) and posterior oropharyngeal erythema (left) present.  No oropharyngeal exudate.  Oropharynx edementous and erythematous.  Left tonsillar swelling with slight uvula deviation to the right.  No "hot potato" voice.  Patient able to speak clearly with normal phonation.  Able to tolerate secretions.   Eyes: Conjunctivae are normal.  Neck: Normal range of motion. Neck supple.  Left anterior neck swelling and tenderness.  No overlying erythema, induration, or fluctuance.   Cardiovascular: Normal rate, regular  rhythm and normal heart sounds.   No murmur heard. Pulmonary/Chest: Effort normal and breath sounds normal. No accessory muscle usage or stridor. No respiratory distress. He has no wheezes. He has no rhonchi. He has no rales.  Abdominal: Soft. Bowel sounds are normal. He exhibits no distension. There is no tenderness.  Musculoskeletal: Normal range of motion.  Lymphadenopathy:    He has cervical adenopathy (left anterior).  Neurological: He is alert and oriented to person, place, and time.  Speech clear without dysarthria.  Skin: Skin is warm and dry.  Psychiatric: He has a normal mood and affect. His behavior is normal.    ED Course  Procedures (including critical care time) Labs Review Labs Reviewed  CBC WITH DIFFERENTIAL/PLATELET - Abnormal; Notable for the following:    WBC 18.5 (*)    Neutro Abs 14.3 (*)    Monocytes Absolute 1.2 (*)    All other components within normal limits  RAPID STREP SCREEN (NOT AT [facility])  CULTURE, GROUP A STREP [facility])  BASIC METABOLIC PANEL    Imaging Review No results found. I have personally reviewed and evaluated these images and lab results as part of my medical decision-making.   EKG Interpretation None      MDM   Final diagnoses:  Tonsillitis    VSS, NAD.  Patient does not appear septic or toxic.  Left tonsillar edema and erythema with slight uvula deviation.  Left anterior neck TTP with swelling.  No overlying erythema, induration, or fluctuance.  Doubt Ludwig angina. No "hot potato" voice.  Doubt retropharyngeal abscess. Likely unilateral tonsillitis.  Doubt peritonsillar abscess, no indication for I&D at this time. Will obtain CBC, BMP, and rapid strep given lack of PO intake.  Will give IVF, Decadron, and Clindamycin to cover for infection.  Will PO challenge. Anticipate discharge home with ENT follow up.  Labs remarkable for leukocytosis, 18.5.  Rapid strep negative. No electrolyte derangements. Patient given toradol.   Upon  recheck, patient reports symptom improvement.  He has been able to tolerate PO intake without difficulty.  Tolerating oral secretions.  He does not appear toxic or septic.  Vitals reassuring.  Patient will be discharged home with clindamycin and hycodan for pain control.  Follow up ENT in 2-3 days for recheck.  Discussed return precautions.  Patient agrees and acknowledges the above plan for discharge.   Case has been discussed with and seen by Dr. Rubin Payor who agrees with the above plan for discharge.   Cheri Fowler, PA-C 02/06/16 1145  Cheri Fowler, PA-C 02/06/16 1146  Benjiman Core, MD 02/07/16 [phone]

## 2016-02-06 NOTE — ED Notes (Signed)
Pt presents with c/o sore throat. Pt with swelling to his throat, reports that he is unable to swallow his own spit. Family at bedside report that she had strep recently. Pt also reporting chills, fever, and fatigue.

## 2016-02-06 NOTE — Discharge Instructions (Signed)
1. Continue home medications. 2. Start taking Clindamycin three times a day and Hycodan every 6 hours as needed for pain. 3.  Follow up with ENT in 2-3 days for recheck to evaluate if I&D is necessary or to simply continue taking Clindamycin.  Return if you experience any difficulty breathing, confusion, nausea or vomiting, and are unable to keep fluids down.    Tonsillitis Tonsillitis is an infection of the throat that causes the tonsils to become red, tender, and swollen. Tonsils are collections of lymphoid tissue at the back of the throat. Each tonsil has crevices (crypts). Tonsils help fight nose and throat infections and keep infection from spreading to other parts of the body for the first 18 months of life.  CAUSES Sudden (acute) tonsillitis is usually caused by infection with streptococcal bacteria. Long-lasting (chronic) tonsillitis occurs when the crypts of the tonsils become filled with pieces of food and bacteria, which makes it easy for the tonsils to become repeatedly infected. SYMPTOMS  Symptoms of tonsillitis include:  A sore throat, with possible difficulty swallowing.  White patches on the tonsils.  Fever.  Tiredness.  New episodes of snoring during sleep, when you did not snore before.  Small, foul-smelling, yellowish-white pieces of material (tonsilloliths) that you occasionally cough up or spit out. The tonsilloliths can also cause you to have bad breath. DIAGNOSIS Tonsillitis can be diagnosed through a physical exam. Diagnosis can be confirmed with the results of lab tests, including a throat culture. TREATMENT  The goals of tonsillitis treatment include the reduction of the severity and duration of symptoms and prevention of associated conditions. Symptoms of tonsillitis can be improved with the use of steroids to reduce the swelling. Tonsillitis caused by bacteria can be treated with antibiotic medicines. Usually, treatment with antibiotic medicines is started before  the cause of the tonsillitis is known. However, if it is determined that the cause is not bacterial, antibiotic medicines will not treat the tonsillitis. If attacks of tonsillitis are severe and frequent, your health care provider may recommend surgery to remove the tonsils (tonsillectomy). HOME CARE INSTRUCTIONS   Rest as much as possible and get plenty of sleep.  Drink plenty of fluids. While the throat is very sore, eat soft foods or liquids, such as sherbet, soups, or instant breakfast drinks.  Eat frozen ice pops.  Gargle with a warm or cold liquid to help soothe the throat. Mix 1/4 teaspoon of salt and 1/4 teaspoon of baking soda in 8 oz of water. SEEK MEDICAL CARE IF:   Large, tender lumps develop in your neck.  A rash develops.  A green, yellow-brown, or bloody substance is coughed up.  You are unable to swallow liquids or food for 24 hours.  You notice that only one of the tonsils is swollen. SEEK IMMEDIATE MEDICAL CARE IF:   You develop any new symptoms such as vomiting, severe headache, stiff neck, chest pain, or trouble breathing or swallowing.  You have severe throat pain along with drooling or voice changes.  You have severe pain, unrelieved with recommended medications.  You are unable to fully open the mouth.  You develop redness, swelling, or severe pain anywhere in the neck.  You have a fever. MAKE SURE YOU:   Understand these instructions.  Will watch your condition.  Will get help right away if you are not doing well or get worse.   This information is not intended to replace advice given to you by your health care provider. Make sure you  discuss any questions you have with your health care provider.   Document Released: 08/19/2005 Document Revised: 11/30/2014 Document Reviewed: 04/28/2013 Elsevier Interactive Patient Education [employer]

## 2016-02-06 NOTE — ED Notes (Addendum)
Pt given ice water, tolerated well.

## 2016-02-08 LAB — CULTURE, GROUP A STREP (THRC)

## 2017-11-18 ENCOUNTER — Ambulatory Visit: Payer: PRIVATE HEALTH INSURANCE | Admitting: Registered"

## 2018-12-27 ENCOUNTER — Encounter: Payer: Self-pay | Admitting: Family

## 2018-12-27 ENCOUNTER — Ambulatory Visit (INDEPENDENT_AMBULATORY_CARE_PROVIDER_SITE_OTHER)
Admission: RE | Admit: 2018-12-27 | Discharge: 2018-12-27 | Disposition: A | Discharge status: Home / Self Care | Payer: Managed Care, Other (non HMO) | Source: Ambulatory Visit | Attending: Family | Admitting: Family

## 2018-12-27 ENCOUNTER — Ambulatory Visit: Payer: Managed Care, Other (non HMO) | Admitting: Family

## 2018-12-27 ENCOUNTER — Other Ambulatory Visit (INDEPENDENT_AMBULATORY_CARE_PROVIDER_SITE_OTHER): Payer: Managed Care, Other (non HMO)

## 2018-12-27 VITALS — BP 148/90 | HR 86 | Temp 98.4°F | Ht 72.0 in | Wt 311.1 lb

## 2018-12-27 DIAGNOSIS — E785 Hyperlipidemia, unspecified: Secondary | ICD-10-CM | POA: Diagnosis not present

## 2018-12-27 DIAGNOSIS — Z720 Tobacco use: Secondary | ICD-10-CM | POA: Diagnosis not present

## 2018-12-27 DIAGNOSIS — G8929 Other chronic pain: Secondary | ICD-10-CM | POA: Diagnosis not present

## 2018-12-27 DIAGNOSIS — M5441 Lumbago with sciatica, right side: Secondary | ICD-10-CM

## 2018-12-27 DIAGNOSIS — E119 Type 2 diabetes mellitus without complications: Secondary | ICD-10-CM

## 2018-12-27 DIAGNOSIS — I10 Essential (primary) hypertension: Secondary | ICD-10-CM | POA: Diagnosis not present

## 2018-12-27 DIAGNOSIS — R0681 Apnea, not elsewhere classified: Secondary | ICD-10-CM

## 2018-12-27 LAB — LIPID PANEL
Cholesterol: 308 mg/dL — ABNORMAL HIGH (ref 0–200)
HDL: 34.2 mg/dL — ABNORMAL LOW (ref >39.00)
Total CHOL/HDL Ratio: 9
Triglycerides: 726 mg/dL — ABNORMAL HIGH (ref 0.0–149.0)

## 2018-12-27 LAB — CBC WITH DIFFERENTIAL/PLATELET
BASOS PCT: 1.2 % (ref 0.0–3.0)
Basophils Absolute: 0.1 10*3/uL (ref 0.0–0.1)
Eosinophils Absolute: 0.4 10*3/uL (ref 0.0–0.7)
Eosinophils Relative: 3.4 % (ref 0.0–5.0)
HEMATOCRIT: 47.2 % (ref 39.0–52.0)
Hemoglobin: 16.3 g/dL (ref 13.0–17.0)
LYMPHS PCT: 24.8 % (ref 12.0–46.0)
Lymphs Abs: 2.6 10*3/uL (ref 0.7–4.0)
MCHC: 34.6 g/dL (ref 30.0–36.0)
MCV: 89.8 fl (ref 78.0–100.0)
Monocytes Absolute: 0.6 10*3/uL (ref 0.1–1.0)
Monocytes Relative: 5.7 % (ref 3.0–12.0)
NEUTROS ABS: 6.8 10*3/uL (ref 1.4–7.7)
Neutrophils Relative %: 64.9 % (ref 43.0–77.0)
Platelets: 374 10*3/uL (ref 150.0–400.0)
RBC: 5.26 Mil/uL (ref 4.22–5.81)
RDW: 12.7 % (ref 11.5–15.5)
WBC: 10.5 10*3/uL (ref 4.0–10.5)

## 2018-12-27 LAB — COMPREHENSIVE METABOLIC PANEL
ALBUMIN: 4.6 g/dL (ref 3.5–5.2)
ALT: 75 U/L — ABNORMAL HIGH (ref 0–53)
AST: 28 U/L (ref 0–37)
Alkaline Phosphatase: 123 U/L — ABNORMAL HIGH (ref 39–117)
BUN: 10 mg/dL (ref 6–23)
CHLORIDE: 96 meq/L (ref 96–112)
CO2: 28 meq/L (ref 19–32)
Calcium: 10.5 mg/dL (ref 8.4–10.5)
Creatinine, Ser: 0.78 mg/dL (ref 0.40–1.50)
GFR: 134.81 mL/min (ref >60.00)
Glucose, Bld: 379 mg/dL — ABNORMAL HIGH (ref 70–99)
Potassium: 4.4 mEq/L (ref 3.5–5.1)
Sodium: 135 mEq/L (ref 135–145)
Total Bilirubin: 1 mg/dL (ref 0.2–1.2)
Total Protein: 7.7 g/dL (ref 6.0–8.3)

## 2018-12-27 LAB — HEMOGLOBIN A1C: Hgb A1c MFr Bld: 12.5 % — ABNORMAL HIGH (ref 4.6–6.5)

## 2018-12-27 LAB — LDL CHOLESTEROL, DIRECT: Direct LDL: 102 mg/dL

## 2018-12-27 MED ORDER — AMLODIPINE BESYLATE 10 MG PO TABS: 10.0000 mg | ORAL_TABLET | Freq: Every day | ORAL | 1 refills | Status: DC

## 2018-12-27 MED ORDER — LISINOPRIL-HYDROCHLOROTHIAZIDE 20-12.5 MG PO TABS: 1.0000 | ORAL_TABLET | Freq: Every day | ORAL | 1 refills | Status: DC

## 2018-12-27 MED ORDER — METFORMIN HCL 500 MG PO TABS: 500.0000 mg | ORAL_TABLET | Freq: Two times a day (BID) | ORAL | 1 refills | Status: DC

## 2018-12-27 NOTE — Progress Notes (Signed)
Craig Rowland is a 38 y.o. male with the following history as recorded in EpicCare:  Patient Active Problem List   Diagnosis Date Noted  . Essential hypertension 12/27/2018    Current Outpatient Medications  Medication Sig Dispense Refill  . amLODipine (NORVASC) 10 MG tablet Take 1 tablet (10 mg total) by mouth daily. 30 tablet 1  . atorvastatin (LIPITOR) 20 MG tablet Take 20 mg by mouth daily.    Marland Kitchen ibuprofen (ADVIL,MOTRIN) 200 MG tablet Take 1,000 mg by mouth 2 (two) times daily as needed for headache, mild pain or moderate pain.     Marland Kitchen lisinopril-hydrochlorothiazide (PRINZIDE,ZESTORETIC) 20-12.5 MG tablet Take 1 tablet by mouth daily. 30 tablet 1  . metFORMIN (GLUCOPHAGE) 500 MG tablet Take 1 tablet (500 mg total) by mouth 2 (two) times daily with a meal. 60 tablet 1  . Multiple Vitamin (MULTIVITAMIN WITH MINERALS) TABS tablet Take 1 tablet by mouth daily.     No current facility-administered medications for this visit.     Allergies: Patient has no known allergies.  Past Medical History:  Diagnosis Date  . Hyperlipidemia   . Hypertension   . Tobacco abuse   . Type 2 diabetes mellitus ([place])     History reviewed. No pertinent surgical history.  Family History  Family history unknown: Yes    Social History   Tobacco Use  . Smoking status: Current Every Day Smoker    Packs/day: 0.50    Types: Cigarettes  . Smokeless tobacco: Never Used  Substance Use Topics  . Alcohol use: Yes    Comment: social    Subjective:  Patient presents today as a new patient; accompanied by his wife; History of hypertension/ Type 2 Diabetes/ hyperlipidemia- not currently taking any medication; has been off his medications for at least the past 2 months; Denies any chest pain, shortness of breath, blurred vision or headache + smoker- not interested in quitting smoking; Also has history of chronic low back pain- mentions sciatica on right side; Wife is concerned that patient has sleep apnea- she  feels that he will stop breathing at night; also notes that he is a snorer   Objective:  Vitals:   12/27/18 0918  BP: (!) 148/90  Pulse: 86  Temp: 98.4 F (36.9 C)  TempSrc: Oral  SpO2: 97%  Weight: (!) 311 lb 1.3 oz (141.1 kg)  Height: 6' (1.829 m)    General: Well developed, well nourished, in no acute distress  Skin : Warm and dry.  Head: Normocephalic and atraumatic  Eyes: Sclera and conjunctiva clear; pupils round and reactive to light; extraocular movements intact  Ears: External normal; canals clear; tympanic membranes normal  Oropharynx: Pink, supple. No suspicious lesions  Neck: Supple without thyromegaly, adenopathy  Lungs: Respirations unlabored; clear to auscultation bilaterally without wheeze, rales, rhonchi  CVS exam: normal rate and regular rhythm.  Neurologic: Alert and oriented; speech intact; face symmetrical; moves all extremities well; CNII-XII intact without focal deficit   Assessment:  1. Essential hypertension   2. Type 2 diabetes mellitus without complication, without long-term current use of insulin ([place])   3. Hyperlipidemia, unspecified hyperlipidemia type   4. Tobacco abuse   5. Witnessed episode of apnea   6. Chronic bilateral low back pain with right-sided sciatica     Plan:  1. Uncontrolled; re-start Amlodipine and Lisinopril HCT; follow-up in 1 month to re-check; 2. Check CBC, CMP, lipid panel, Hgba1c today; re-start Metformin 500 mg bid; follow-up in 1 month, sooner prn. 3.  Will plan to re-start statin at later date- patient mentioned that he was having "cramps" when he was on medication previously; want to get blood pressure and diabetes managed before starting statin; 4. Encouraged to quit smoking; 5. Refer for sleep study; 6. Update lumbar X-ray; follow-up to be determined.  Return in about 1 month (around 01/25/2019).  Orders Placed This Encounter  Procedures  . DG Lumbar Spine 2-3 Views    Standing Status:   Future    Number of  Occurrences:   1    Standing Expiration Date:   02/25/2020    Order Specific Question:   Reason for Exam (SYMPTOM  OR DIAGNOSIS REQUIRED)    Answer:   low back pain    Order Specific Question:   Preferred imaging location?    Answer:   [facility]    Order Specific Question:   Radiology Contrast Protocol - do NOT remove file path    Answer:   [email or web address]  . CBC w/Diff    Standing Status:   Future    Number of Occurrences:   1    Standing Expiration Date:   12/27/2019  . Comp Met (CMET)    Standing Status:   Future    Number of Occurrences:   1    Standing Expiration Date:   12/27/2019  . HgB A1c    Standing Status:   Future    Number of Occurrences:   1    Standing Expiration Date:   12/27/2019  . Lipid panel    Standing Status:   Future    Number of Occurrences:   1    Standing Expiration Date:   12/28/2019  . Ambulatory referral to Neurology    Referral Priority:   Routine    Referral Type:   Consultation    Referral Reason:   Specialty Services Required    Requested Specialty:   Neurology    Number of Visits Requested:   1    Requested Prescriptions   Signed Prescriptions Disp Refills  . amLODipine (NORVASC) 10 MG tablet 30 tablet 1    Sig: Take 1 tablet (10 mg total) by mouth daily.  Marland Kitchen lisinopril-hydrochlorothiazide (PRINZIDE,ZESTORETIC) 20-12.5 MG tablet 30 tablet 1    Sig: Take 1 tablet by mouth daily.  . metFORMIN (GLUCOPHAGE) 500 MG tablet 60 tablet 1    Sig: Take 1 tablet (500 mg total) by mouth 2 (two) times daily with a meal.

## 2018-12-30 ENCOUNTER — Other Ambulatory Visit: Payer: Self-pay | Admitting: Family

## 2018-12-30 DIAGNOSIS — G8929 Other chronic pain: Secondary | ICD-10-CM

## 2018-12-30 DIAGNOSIS — M545 Low back pain, unspecified: Secondary | ICD-10-CM

## 2018-12-30 MED ORDER — METFORMIN HCL 1000 MG PO TABS: 1000.0000 mg | ORAL_TABLET | Freq: Two times a day (BID) | ORAL | 2 refills | Status: DC

## 2018-12-30 MED ORDER — MELOXICAM 15 MG PO TABS: 15.0000 mg | ORAL_TABLET | Freq: Every day | ORAL | 0 refills | Status: DC

## 2019-01-02 ENCOUNTER — Other Ambulatory Visit: Payer: Self-pay | Admitting: Family

## 2019-01-02 ENCOUNTER — Telehealth: Payer: Self-pay

## 2019-01-02 DIAGNOSIS — G8929 Other chronic pain: Secondary | ICD-10-CM

## 2019-01-02 DIAGNOSIS — M545 Low back pain: Principal | ICD-10-CM

## 2019-01-02 MED ORDER — DULAGLUTIDE 0.75 MG/0.5ML ~~LOC~~ SOAJ: SUBCUTANEOUS | 1 refills | Status: DC

## 2019-01-02 NOTE — Telephone Encounter (Signed)
Copied from CRM [id number]. Topic: General - Other >> Dec 30, 2018  4:39 PM Wyonia Hough E wrote: Reason for CRM: Pt checked with insurance and both TRULICITY and Ozempic are both covered. Please advise

## 2019-01-02 NOTE — Addendum Note (Signed)
Addended by: Eustace Moore on: 01/02/2019 03:29 PM   Modules accepted: Orders

## 2019-01-02 NOTE — Telephone Encounter (Signed)
Spoke with patient. He is coming in on Thursday for injection instruction.  He doesn't have an orthopedist he is working with so he is okay with you referring him out.  Thanks

## 2019-01-02 NOTE — Telephone Encounter (Signed)
I am going to send in Trulicity for him- this is a once a week injection; he can schedule a nurse visit to have someone show him how to do the injection. We will start with a low dose and increase as needed. He will stay on the Metformin.  Also, his insurance would not approve the MRI I ordered. This is common and we would have him see a back specialist. They can get the MRI with no problems at the specialist office. Has he been working with orthopedist?

## 2019-01-05 ENCOUNTER — Ambulatory Visit: Payer: Managed Care, Other (non HMO)

## 2019-01-05 ENCOUNTER — Telehealth: Payer: Self-pay | Admitting: Family

## 2019-01-05 ENCOUNTER — Other Ambulatory Visit: Payer: Managed Care, Other (non HMO)

## 2019-01-05 NOTE — Telephone Encounter (Signed)
Copied from CRM [id number]. Topic: Quick Communication - See Telephone Encounter >> Jan 05, 2019 10:10 AM Lorrine Kin, NT wrote: CRM for notification. See Telephone encounter for: 01/05/19. Patient's wife, Vikki Ports, calling and has some FMLA questions. Would not go into detail about what questions she had. Please advise.  CB#: [phone]

## 2019-01-06 NOTE — Telephone Encounter (Signed)
LVM requesting a call back.   Also if patient has not seen/spoke with the provider about FMLA , he will need to set up an appointment to do so.   FYI Carla.

## 2019-01-12 ENCOUNTER — Ambulatory Visit (INDEPENDENT_AMBULATORY_CARE_PROVIDER_SITE_OTHER): Payer: Self-pay | Admitting: Orthopedic Surgery

## 2019-01-24 ENCOUNTER — Ambulatory Visit: Payer: Managed Care, Other (non HMO) | Admitting: Family

## 2019-01-26 ENCOUNTER — Other Ambulatory Visit: Payer: Self-pay | Admitting: Family

## 2019-01-27 ENCOUNTER — Ambulatory Visit: Payer: BLUE CROSS/BLUE SHIELD | Admitting: Family

## 2019-01-27 ENCOUNTER — Encounter: Payer: Self-pay | Admitting: Family

## 2019-01-27 ENCOUNTER — Other Ambulatory Visit (INDEPENDENT_AMBULATORY_CARE_PROVIDER_SITE_OTHER): Payer: BLUE CROSS/BLUE SHIELD

## 2019-01-27 VITALS — BP 150/96 | HR 86 | Temp 98.0°F | Ht 72.0 in | Wt 319.0 lb

## 2019-01-27 DIAGNOSIS — M545 Low back pain: Secondary | ICD-10-CM

## 2019-01-27 DIAGNOSIS — I10 Essential (primary) hypertension: Secondary | ICD-10-CM

## 2019-01-27 DIAGNOSIS — E119 Type 2 diabetes mellitus without complications: Secondary | ICD-10-CM | POA: Diagnosis not present

## 2019-01-27 DIAGNOSIS — R0681 Apnea, not elsewhere classified: Secondary | ICD-10-CM | POA: Diagnosis not present

## 2019-01-27 DIAGNOSIS — G8929 Other chronic pain: Secondary | ICD-10-CM

## 2019-01-27 LAB — COMPREHENSIVE METABOLIC PANEL
ALBUMIN: 4.7 g/dL (ref 3.5–5.2)
ALT: 26 U/L (ref 0–53)
AST: 16 U/L (ref 0–37)
Alkaline Phosphatase: 74 U/L (ref 39–117)
BUN: 10 mg/dL (ref 6–23)
CALCIUM: 9.8 mg/dL (ref 8.4–10.5)
CO2: 29 mEq/L (ref 19–32)
Chloride: 100 mEq/L (ref 96–112)
Creatinine, Ser: 0.75 mg/dL (ref 0.40–1.50)
GFR: 140.99 mL/min (ref >60.00)
Glucose, Bld: 192 mg/dL — ABNORMAL HIGH (ref 70–99)
Potassium: 3.5 mEq/L (ref 3.5–5.1)
Sodium: 138 mEq/L (ref 135–145)
Total Bilirubin: 1.1 mg/dL (ref 0.2–1.2)
Total Protein: 7.6 g/dL (ref 6.0–8.3)

## 2019-01-27 LAB — CBC WITH DIFFERENTIAL/PLATELET
Basophils Absolute: 0.1 10*3/uL (ref 0.0–0.1)
Basophils Relative: 1 % (ref 0.0–3.0)
EOS ABS: 0.5 10*3/uL (ref 0.0–0.7)
Eosinophils Relative: 3.1 % (ref 0.0–5.0)
HCT: 39.7 % (ref 39.0–52.0)
Hemoglobin: 13.9 g/dL (ref 13.0–17.0)
LYMPHS ABS: 4 10*3/uL (ref 0.7–4.0)
LYMPHS PCT: 27.8 % (ref 12.0–46.0)
MCHC: 34.9 g/dL (ref 30.0–36.0)
MCV: 91 fl (ref 78.0–100.0)
MONO ABS: 0.8 10*3/uL (ref 0.1–1.0)
Monocytes Relative: 5.4 % (ref 3.0–12.0)
Neutro Abs: 9 10*3/uL — ABNORMAL HIGH (ref 1.4–7.7)
Neutrophils Relative %: 62.7 % (ref 43.0–77.0)
Platelets: 354 10*3/uL (ref 150.0–400.0)
RBC: 4.36 Mil/uL (ref 4.22–5.81)
RDW: 13.3 % (ref 11.5–15.5)
WBC: 14.3 10*3/uL — ABNORMAL HIGH (ref 4.0–10.5)

## 2019-01-27 MED ORDER — LISINOPRIL-HYDROCHLOROTHIAZIDE 20-12.5 MG PO TABS: 2.0000 | ORAL_TABLET | Freq: Every day | ORAL | 2 refills | Status: DC

## 2019-01-27 MED ORDER — AMLODIPINE BESYLATE 10 MG PO TABS: 10.0000 mg | ORAL_TABLET | Freq: Every day | ORAL | 3 refills | Status: DC

## 2019-01-27 MED ORDER — MELOXICAM 15 MG PO TABS: 15.0000 mg | ORAL_TABLET | Freq: Every day | ORAL | 2 refills | Status: DC

## 2019-01-27 NOTE — Progress Notes (Signed)
Craig Rowland is a 38 y.o. male with the following history as recorded in EpicCare:  Patient Active Problem List   Diagnosis Date Noted  . Type 2 diabetes mellitus without complication, without long-term current use of insulin ([place]) 01/27/2019  . Chronic low back pain 01/27/2019  . Essential hypertension 12/27/2018    Current Outpatient Medications  Medication Sig Dispense Refill  . amLODipine (NORVASC) 10 MG tablet Take 1 tablet (10 mg total) by mouth daily. 30 tablet 3  . atorvastatin (LIPITOR) 20 MG tablet Take 20 mg by mouth daily.    . Dulaglutide (TRULICITY) 0.25 EN/2.7PO SOPN Use weekly as directed 4 pen 1  . ibuprofen (ADVIL,MOTRIN) 200 MG tablet Take 1,000 mg by mouth 2 (two) times daily as needed for headache, mild pain or moderate pain.     Marland Kitchen lisinopril-hydrochlorothiazide (PRINZIDE,ZESTORETIC) 20-12.5 MG tablet Take 2 tablets by mouth daily. 60 tablet 2  . meloxicam (MOBIC) 15 MG tablet Take 1 tablet (15 mg total) by mouth daily. 30 tablet 2  . metFORMIN (GLUCOPHAGE) 1000 MG tablet Take 1 tablet (1,000 mg total) by mouth 2 (two) times daily with a meal. 60 tablet 2  . Multiple Vitamin (MULTIVITAMIN WITH MINERALS) TABS tablet Take 1 tablet by mouth daily.     No current facility-administered medications for this visit.     Allergies: Patient has no known allergies.  Past Medical History:  Diagnosis Date  . Hyperlipidemia   . Hypertension   . Tobacco abuse   . Type 2 diabetes mellitus ([place])     History reviewed. No pertinent surgical history.  Family History  Family history unknown: Yes    Social History   Tobacco Use  . Smoking status: Current Every Day Smoker    Packs/day: 0.50    Types: Cigarettes  . Smokeless tobacco: Never Used  Substance Use Topics  . Alcohol use: Yes    Comment: social    Subjective:  1 month follow-up on chronic care needs- Hypertension/ Type 2 Diabetes;  On Amlodipine and Lisinopril HCT- taking medication as prescribed; Currently  on Trulicity and Metformin- does not check blood sugars regularly; notes he has been feeling much better however- no problems with urinating at night; in general, "just feel much better." Denies any chest pain, shortness of breath, blurred vision or headache   Also requesting refill on Mobic for low back pain; planning to re-schedule with orthopedics;      Objective:  Vitals:   01/27/19 1548  BP: (!) 150/96  Pulse: 86  Temp: 98 F (36.7 C)  TempSrc: Oral  SpO2: 97%  Weight: (!) 319 lb (144.7 kg)  Height: 6' (1.829 m)    General: Well developed, well nourished, in no acute distress  Skin : Warm and dry.  Head: Normocephalic and atraumatic  Lungs: Respirations unlabored; clear to auscultation bilaterally without wheeze, rales, rhonchi  CVS exam: normal rate and regular rhythm.  Musculoskeletal: No deformities; no active joint inflammation  Extremities: No edema, cyanosis, clubbing  Vessels: Symmetric bilaterally  Neurologic: Alert and oriented; speech intact; face symmetrical; moves all extremities well; CNII-XII intact without focal deficit   Assessment:  1. Type 2 diabetes mellitus without complication, without long-term current use of insulin ([place])   2. Essential hypertension   3. Chronic low back pain, unspecified back pain laterality, unspecified whether sciatica present   4. Witnessed episode of apnea     Plan:  1. Check CMP, CBC today; may increase Trulicity to 1.5 mg; follow-up in 2  months; 2. Uncontrolled; continue Amlodipine 10 mg and increase Lisinopril to 2 tablets daily; follow-up in 2 months; 3. Refill on Mobic; patient plans to call orthopedics to re-schedule; 4. Patient will need to re-schedule his consult with neurology scheduled for next week; contact # is given.   No follow-ups on file.  Orders Placed This Encounter  Procedures  . CBC w/Diff    Standing Status:   Future    Number of Occurrences:   1    Standing Expiration Date:   01/27/2020  . Comp Met  (CMET)    Standing Status:   Future    Number of Occurrences:   1    Standing Expiration Date:   01/27/2020    Requested Prescriptions   Signed Prescriptions Disp Refills  . lisinopril-hydrochlorothiazide (PRINZIDE,ZESTORETIC) 20-12.5 MG tablet 60 tablet 2    Sig: Take 2 tablets by mouth daily.  Marland Kitchen amLODipine (NORVASC) 10 MG tablet 30 tablet 3    Sig: Take 1 tablet (10 mg total) by mouth daily.  . meloxicam (MOBIC) 15 MG tablet 30 tablet 2    Sig: Take 1 tablet (15 mg total) by mouth daily.

## 2019-01-30 ENCOUNTER — Other Ambulatory Visit: Payer: Self-pay | Admitting: Family

## 2019-01-30 MED ORDER — DULAGLUTIDE 1.5 MG/0.5ML ~~LOC~~ SOAJ: SUBCUTANEOUS | 2 refills | Status: DC

## 2019-02-02 ENCOUNTER — Institutional Professional Consult (permissible substitution): Payer: Self-pay | Admitting: Neurology

## 2019-03-15 ENCOUNTER — Other Ambulatory Visit: Payer: Self-pay | Admitting: Family

## 2019-03-15 ENCOUNTER — Telehealth: Payer: Self-pay

## 2019-03-15 DIAGNOSIS — E119 Type 2 diabetes mellitus without complications: Secondary | ICD-10-CM

## 2019-03-15 NOTE — Telephone Encounter (Signed)
Message sent to [name] today. 

## 2019-03-21 ENCOUNTER — Other Ambulatory Visit (INDEPENDENT_AMBULATORY_CARE_PROVIDER_SITE_OTHER): Payer: BLUE CROSS/BLUE SHIELD

## 2019-03-21 DIAGNOSIS — E119 Type 2 diabetes mellitus without complications: Secondary | ICD-10-CM | POA: Diagnosis not present

## 2019-03-21 LAB — COMPREHENSIVE METABOLIC PANEL
ALT: 22 U/L (ref 0–53)
AST: 13 U/L (ref 0–37)
Albumin: 4.4 g/dL (ref 3.5–5.2)
Alkaline Phosphatase: 60 U/L (ref 39–117)
BUN: 15 mg/dL (ref 6–23)
CO2: 26 mEq/L (ref 19–32)
Calcium: 9.3 mg/dL (ref 8.4–10.5)
Chloride: 104 mEq/L (ref 96–112)
Creatinine, Ser: 0.8 mg/dL (ref 0.40–1.50)
GFR: 130.77 mL/min (ref >60.00)
Glucose, Bld: 189 mg/dL — ABNORMAL HIGH (ref 70–99)
Potassium: 3.8 mEq/L (ref 3.5–5.1)
Sodium: 140 mEq/L (ref 135–145)
Total Bilirubin: 0.5 mg/dL (ref 0.2–1.2)
Total Protein: 7.3 g/dL (ref 6.0–8.3)

## 2019-03-21 LAB — CBC WITH DIFFERENTIAL/PLATELET
Basophils Absolute: 0.1 10*3/uL (ref 0.0–0.1)
Basophils Relative: 1 % (ref 0.0–3.0)
Eosinophils Absolute: 0.5 10*3/uL (ref 0.0–0.7)
Eosinophils Relative: 4.4 % (ref 0.0–5.0)
HCT: 38 % — ABNORMAL LOW (ref 39.0–52.0)
Hemoglobin: 13.1 g/dL (ref 13.0–17.0)
Lymphocytes Relative: 32 % (ref 12.0–46.0)
Lymphs Abs: 3.8 10*3/uL (ref 0.7–4.0)
MCHC: 34.5 g/dL (ref 30.0–36.0)
MCV: 92.7 fl (ref 78.0–100.0)
Monocytes Absolute: 0.6 10*3/uL (ref 0.1–1.0)
Monocytes Relative: 4.9 % (ref 3.0–12.0)
Neutro Abs: 6.9 10*3/uL (ref 1.4–7.7)
Neutrophils Relative %: 57.7 % (ref 43.0–77.0)
Platelets: 326 10*3/uL (ref 150.0–400.0)
RBC: 4.1 Mil/uL — ABNORMAL LOW (ref 4.22–5.81)
RDW: 12.7 % (ref 11.5–15.5)
WBC: 12 10*3/uL — ABNORMAL HIGH (ref 4.0–10.5)

## 2019-03-21 LAB — HEMOGLOBIN A1C: Hgb A1c MFr Bld: 7.9 % — ABNORMAL HIGH (ref 4.6–6.5)

## 2019-03-21 LAB — LIPID PANEL
Cholesterol: 257 mg/dL — ABNORMAL HIGH (ref 0–200)
HDL: 33.7 mg/dL — ABNORMAL LOW (ref >39.00)
LDL Cholesterol: 189 mg/dL — ABNORMAL HIGH (ref 0–99)
NonHDL: 223.51
Total CHOL/HDL Ratio: 8
Triglycerides: 171 mg/dL — ABNORMAL HIGH (ref 0.0–149.0)
VLDL: 34.2 mg/dL (ref 0.0–40.0)

## 2019-03-31 ENCOUNTER — Ambulatory Visit: Payer: Managed Care, Other (non HMO) | Admitting: Family

## 2019-04-05 ENCOUNTER — Encounter: Payer: Self-pay | Admitting: Family

## 2019-04-05 ENCOUNTER — Ambulatory Visit (INDEPENDENT_AMBULATORY_CARE_PROVIDER_SITE_OTHER): Payer: BLUE CROSS/BLUE SHIELD | Admitting: Family

## 2019-04-05 VITALS — Wt 310.0 lb

## 2019-04-05 DIAGNOSIS — Z720 Tobacco use: Secondary | ICD-10-CM | POA: Diagnosis not present

## 2019-04-05 DIAGNOSIS — I10 Essential (primary) hypertension: Secondary | ICD-10-CM | POA: Diagnosis not present

## 2019-04-05 DIAGNOSIS — E119 Type 2 diabetes mellitus without complications: Secondary | ICD-10-CM | POA: Diagnosis not present

## 2019-04-05 DIAGNOSIS — E785 Hyperlipidemia, unspecified: Secondary | ICD-10-CM

## 2019-04-05 MED ORDER — MELOXICAM 15 MG PO TABS: 15.0000 mg | ORAL_TABLET | Freq: Every day | ORAL | 2 refills | Status: DC

## 2019-04-05 MED ORDER — AMLODIPINE BESYLATE 10 MG PO TABS: 10.0000 mg | ORAL_TABLET | Freq: Every day | ORAL | 3 refills | Status: DC

## 2019-04-05 MED ORDER — METFORMIN HCL 1000 MG PO TABS: 1000.0000 mg | ORAL_TABLET | Freq: Two times a day (BID) | ORAL | 3 refills | Status: DC

## 2019-04-05 MED ORDER — ATORVASTATIN CALCIUM 20 MG PO TABS: 20.0000 mg | ORAL_TABLET | Freq: Every day | ORAL | 3 refills | Status: DC

## 2019-04-05 MED ORDER — LISINOPRIL-HYDROCHLOROTHIAZIDE 20-12.5 MG PO TABS: 2.0000 | ORAL_TABLET | Freq: Every day | ORAL | 3 refills | Status: DC

## 2019-04-05 MED ORDER — DULAGLUTIDE 1.5 MG/0.5ML ~~LOC~~ SOAJ: SUBCUTANEOUS | 3 refills | Status: DC

## 2019-04-05 NOTE — Progress Notes (Signed)
Craig Rowland is a 38 y.o. male with the following history as recorded in EpicCare:  Patient Active Problem List   Diagnosis Date Noted  . Type 2 diabetes mellitus without complication, without long-term current use of insulin (HCC) 01/27/2019  . Chronic low back pain 01/27/2019  . Essential hypertension 12/27/2018    Current Outpatient Medications  Medication Sig Dispense Refill  . amLODipine (NORVASC) 10 MG tablet Take 1 tablet (10 mg total) by mouth daily. 30 tablet 3  . atorvastatin (LIPITOR) 20 MG tablet Take 20 mg by mouth daily.    . Dulaglutide (TRULICITY) 1.5 MG/0.5ML SOPN Inject once weekly as directed 4 pen 2  . ibuprofen (ADVIL,MOTRIN) 200 MG tablet Take 1,000 mg by mouth 2 (two) times daily as needed for headache, mild pain or moderate pain.     Marland Kitchen. lisinopril-hydrochlorothiazide (PRINZIDE,ZESTORETIC) 20-12.5 MG tablet Take 2 tablets by mouth daily. 60 tablet 2  . meloxicam (MOBIC) 15 MG tablet Take 1 tablet (15 mg total) by mouth daily. 30 tablet 2  . metFORMIN (GLUCOPHAGE) 1000 MG tablet Take 1 tablet (1,000 mg total) by mouth 2 (two) times daily with a meal. 60 tablet 2  . Multiple Vitamin (MULTIVITAMIN WITH MINERALS) TABS tablet Take 1 tablet by mouth daily.     No current facility-administered medications for this visit.     Allergies: Patient has no known allergies.  Past Medical History:  Diagnosis Date  . Hyperlipidemia   . Hypertension   . Tobacco abuse   . Type 2 diabetes mellitus (HCC)     No past surgical history on file.  Family History  Family history unknown: Yes    Social History   Tobacco Use  . Smoking status: Current Every Day Smoker    Packs/day: 0.50    Types: Cigarettes  . Smokeless tobacco: Never Used  Substance Use Topics  . Alcohol use: Yes    Comment: social    Subjective:    I connected with Craig Rowland on 04/05/19 at 12:40 PM EDT by a video enabled telemedicine application and verified that I am speaking with the correct  person using two identifiers. Patient, his wife and I are present on the video call.    I discussed the limitations of evaluation and management by telemedicine and the availability of in person appointments. The patient expressed understanding and agreed to proceed.  2 month follow-up on Type 2 Diabetes; labs done previously- good improvement in control- Hgba1c down to 7.9; has lost 9 pounds since OV in March- weight is down to 310 pounds; not checking his blood pressure regularly; Denies any chest pain, shortness of breath, blurred vision or headache  Has opted against sleep study- sleeping much better with weight loss; wife has not heard concerning symptoms; costs are prohibitive as well;  Notes that back is doing better as well- had to cancel MRI and follow-up with sports medicine due to cost concerns; feeling better with Mobic; agrees to re-visit seeing specialist at later date      Objective:  Vitals:   04/05/19 1252  Weight: (!) 310 lb (140.6 kg)    General: Well developed, well nourished,   Head: Normocephalic and atraumatic  Lungs: Respirations unlabored;  Neurologic: Alert and oriented; speech intact; face symmetrical;    Assessment:  1. Type 2 diabetes mellitus without complication, without long-term current use of insulin (HCC)   2. Essential hypertension   3. Hyperlipidemia, unspecified hyperlipidemia type   4. Tobacco abuse     Plan:  1. Control improving; continue Metformin and Trulicity; follow up in 3 months; 2. Unable to get readings today; continue same medications; follow-up in 3 months; 3. Re-start Atorvastatin 20 mg daily; 4. Stressed need to quit smoking;   No follow-ups on file.  No orders of the defined types were placed in this encounter.   Requested Prescriptions    No prescriptions requested or ordered in this encounter

## 2019-07-05 ENCOUNTER — Other Ambulatory Visit: Payer: Self-pay

## 2019-07-05 ENCOUNTER — Encounter: Payer: Self-pay | Admitting: Family

## 2019-07-05 ENCOUNTER — Other Ambulatory Visit (INDEPENDENT_AMBULATORY_CARE_PROVIDER_SITE_OTHER): Payer: BC Managed Care – PPO

## 2019-07-05 ENCOUNTER — Ambulatory Visit (INDEPENDENT_AMBULATORY_CARE_PROVIDER_SITE_OTHER): Payer: BC Managed Care – PPO | Admitting: Family

## 2019-07-05 VITALS — BP 156/90 | HR 75 | Temp 98.0°F | Ht 72.0 in | Wt 325.1 lb

## 2019-07-05 DIAGNOSIS — E785 Hyperlipidemia, unspecified: Secondary | ICD-10-CM

## 2019-07-05 DIAGNOSIS — I10 Essential (primary) hypertension: Secondary | ICD-10-CM | POA: Diagnosis not present

## 2019-07-05 DIAGNOSIS — E119 Type 2 diabetes mellitus without complications: Secondary | ICD-10-CM

## 2019-07-05 LAB — LIPID PANEL
Cholesterol: 164 mg/dL (ref 0–200)
HDL: 30.9 mg/dL — ABNORMAL LOW (ref >39.00)
LDL Cholesterol: 109 mg/dL — ABNORMAL HIGH (ref 0–99)
NonHDL: 133.22
Total CHOL/HDL Ratio: 5
Triglycerides: 119 mg/dL (ref 0.0–149.0)
VLDL: 23.8 mg/dL (ref 0.0–40.0)

## 2019-07-05 LAB — COMPREHENSIVE METABOLIC PANEL
ALT: 20 U/L (ref 0–53)
AST: 13 U/L (ref 0–37)
Albumin: 4.5 g/dL (ref 3.5–5.2)
Alkaline Phosphatase: 58 U/L (ref 39–117)
BUN: 14 mg/dL (ref 6–23)
CO2: 27 mEq/L (ref 19–32)
Calcium: 9.8 mg/dL (ref 8.4–10.5)
Chloride: 104 mEq/L (ref 96–112)
Creatinine, Ser: 0.77 mg/dL (ref 0.40–1.50)
GFR: 136.46 mL/min (ref >60.00)
Glucose, Bld: 144 mg/dL — ABNORMAL HIGH (ref 70–99)
Potassium: 3.6 mEq/L (ref 3.5–5.1)
Sodium: 140 mEq/L (ref 135–145)
Total Bilirubin: 0.8 mg/dL (ref 0.2–1.2)
Total Protein: 7.2 g/dL (ref 6.0–8.3)

## 2019-07-05 LAB — CBC WITH DIFFERENTIAL/PLATELET
Basophils Absolute: 0 10*3/uL (ref 0.0–0.1)
Basophils Relative: 0.4 % (ref 0.0–3.0)
Eosinophils Absolute: 0.5 10*3/uL (ref 0.0–0.7)
Eosinophils Relative: 4.3 % (ref 0.0–5.0)
HCT: 37.9 % — ABNORMAL LOW (ref 39.0–52.0)
Hemoglobin: 12.9 g/dL — ABNORMAL LOW (ref 13.0–17.0)
Lymphocytes Relative: 25.8 % (ref 12.0–46.0)
Lymphs Abs: 3.1 10*3/uL (ref 0.7–4.0)
MCHC: 34 g/dL (ref 30.0–36.0)
MCV: 92.9 fl (ref 78.0–100.0)
Monocytes Absolute: 0.5 10*3/uL (ref 0.1–1.0)
Monocytes Relative: 4 % (ref 3.0–12.0)
Neutro Abs: 7.9 10*3/uL — ABNORMAL HIGH (ref 1.4–7.7)
Neutrophils Relative %: 65.5 % (ref 43.0–77.0)
Platelets: 347 10*3/uL (ref 150.0–400.0)
RBC: 4.09 Mil/uL — ABNORMAL LOW (ref 4.22–5.81)
RDW: 12.6 % (ref 11.5–15.5)
WBC: 12 10*3/uL — ABNORMAL HIGH (ref 4.0–10.5)

## 2019-07-05 LAB — HEMOGLOBIN A1C: Hgb A1c MFr Bld: 7.2 % — ABNORMAL HIGH (ref 4.6–6.5)

## 2019-07-05 MED ORDER — SPIRONOLACTONE 25 MG PO TABS: 25.0000 mg | ORAL_TABLET | ORAL | 2 refills | Status: DC

## 2019-07-05 MED ORDER — NYSTATIN-TRIAMCINOLONE 100000-0.1 UNIT/GM-% EX OINT: 1.0000 "application " | TOPICAL_OINTMENT | Freq: Two times a day (BID) | CUTANEOUS | 0 refills | Status: AC

## 2019-07-05 NOTE — Progress Notes (Signed)
Craig Rowland is a 38 y.o. male with the following history as recorded in EpicCare:  Patient Active Problem List   Diagnosis Date Noted  . Type 2 diabetes mellitus without complication, without long-term current use of insulin (HCC) 01/27/2019  . Chronic low back pain 01/27/2019  . Essential hypertension 12/27/2018    Current Outpatient Medications  Medication Sig Dispense Refill  . amLODipine (NORVASC) 10 MG tablet Take 1 tablet (10 mg total) by mouth daily. 30 tablet 3  . atorvastatin (LIPITOR) 20 MG tablet Take 1 tablet (20 mg total) by mouth daily. 30 tablet 3  . Dulaglutide (TRULICITY) 1.5 MG/0.5ML SOPN Inject once weekly as directed 4 pen 3  . ibuprofen (ADVIL,MOTRIN) 200 MG tablet Take 1,000 mg by mouth 2 (two) times daily as needed for headache, mild pain or moderate pain.     Marland Kitchen. lisinopril-hydrochlorothiazide (ZESTORETIC) 20-12.5 MG tablet Take 2 tablets by mouth daily. 60 tablet 3  . meloxicam (MOBIC) 15 MG tablet Take 1 tablet (15 mg total) by mouth daily. 30 tablet 2  . metFORMIN (GLUCOPHAGE) 1000 MG tablet Take 1 tablet (1,000 mg total) by mouth 2 (two) times daily with a meal. 60 tablet 3  . Multiple Vitamin (MULTIVITAMIN WITH MINERALS) TABS tablet Take 1 tablet by mouth daily.    Marland Kitchen. nystatin-triamcinolone ointment (MYCOLOG) Apply 1 application topically 2 (two) times daily. 60 g 0  . spironolactone (ALDACTONE) 25 MG tablet Take 1 tablet (25 mg total) by mouth every morning. 30 tablet 2   No current facility-administered medications for this visit.     Allergies: Patient has no known allergies.  Past Medical History:  Diagnosis Date  . Hyperlipidemia   . Hypertension   . Tobacco abuse   . Type 2 diabetes mellitus (HCC)     History reviewed. No pertinent surgical history.  Family History  Family history unknown: Yes    Social History   Tobacco Use  . Smoking status: Current Every Day Smoker    Packs/day: 0.50    Types: Cigarettes  . Smokeless tobacco: Never Used   Substance Use Topics  . 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 ? Fungal rash on right thigh;  Head: Normocephalic and atraumatic  Lungs: Respirations unlabored; clear to auscultation bilaterally without wheeze, rales, rhonchi  CVS exam: normal rate and regular rhythm.  Neurologic: Alert and oriented; speech intact; face symmetrical; moves all extremities well; CNII-XII intact without focal deficit   Assessment:  1. Type 2 diabetes mellitus without complication, without long-term current use of insulin (HCC)   2. Essential hypertension   3. Hyperlipidemia, unspecified hyperlipidemia type     Plan:  1. ? Control; update labs today; follow-up in 1 month;  refer to dermatology; 2. Uncontrolled; add Spironalactone 25 mg daily; continue Lisinopril HCT 20/25 in the am; change to Amlodipine to 10 mg daily; follow-up in 1 month. 3. Check lipid panel today;  Return in about 1 month (around 08/05/2019).  Orders Placed This Encounter  Procedures  . Referral to Nutrition and Diabetes Services    Referral Priority:   Routine    Referral Type:   Consultation    Referral Reason:   Specialty Services Required    Number of Visits Requested:   1    Requested Prescriptions   Signed  Prescriptions Disp Refills  . spironolactone (ALDACTONE) 25 MG tablet 30 tablet 2    Sig: Take 1 tablet (25 mg total) by mouth every morning.  . nystatin-triamcinolone ointment (MYCOLOG) 60 g 0    Sig: Apply 1 application topically 2 (two) times daily.

## 2019-07-12 ENCOUNTER — Ambulatory Visit: Payer: BLUE CROSS/BLUE SHIELD | Admitting: Family

## 2019-08-09 ENCOUNTER — Ambulatory Visit: Payer: BC Managed Care – PPO | Admitting: Family

## 2019-08-09 ENCOUNTER — Other Ambulatory Visit: Payer: Self-pay | Admitting: Family

## 2019-08-26 ENCOUNTER — Ambulatory Visit (INDEPENDENT_AMBULATORY_CARE_PROVIDER_SITE_OTHER): Payer: BC Managed Care – PPO

## 2019-08-26 DIAGNOSIS — Z23 Encounter for immunization: Secondary | ICD-10-CM | POA: Diagnosis not present

## 2019-09-06 ENCOUNTER — Ambulatory Visit (INDEPENDENT_AMBULATORY_CARE_PROVIDER_SITE_OTHER): Payer: BC Managed Care – PPO | Admitting: Family

## 2019-09-06 ENCOUNTER — Other Ambulatory Visit: Payer: Self-pay

## 2019-09-06 ENCOUNTER — Encounter: Payer: Self-pay | Admitting: Family

## 2019-09-06 ENCOUNTER — Other Ambulatory Visit: Payer: Self-pay | Admitting: Family

## 2019-09-06 VITALS — BP 138/84 | HR 90 | Temp 98.8°F | Ht 72.0 in | Wt 316.2 lb

## 2019-09-06 DIAGNOSIS — I10 Essential (primary) hypertension: Secondary | ICD-10-CM | POA: Diagnosis not present

## 2019-09-06 DIAGNOSIS — E119 Type 2 diabetes mellitus without complications: Secondary | ICD-10-CM | POA: Diagnosis not present

## 2019-09-06 DIAGNOSIS — Z720 Tobacco use: Secondary | ICD-10-CM

## 2019-09-06 MED ORDER — CHANTIX STARTING MONTH PAK 0.5 MG X 11 & 1 MG X 42 PO TABS: ORAL_TABLET | ORAL | 0 refills | Status: AC

## 2019-09-06 NOTE — Progress Notes (Signed)
Craig Rowland is a 38 y.o. male with the following history as recorded in EpicCare:  Patient Active Problem List   Diagnosis Date Noted  . Type 2 diabetes mellitus without complication, without long-term current use of insulin ([place]) 01/27/2019  . Chronic low back pain 01/27/2019  . Essential hypertension 12/27/2018    Current Outpatient Medications  Medication Sig Dispense Refill  . amLODipine (NORVASC) 10 MG tablet Take 1 tablet (10 mg total) by mouth daily. 30 tablet 3  . atorvastatin (LIPITOR) 20 MG tablet Take 1 tablet (20 mg total) by mouth daily. 30 tablet 3  . Dulaglutide (TRULICITY) 1.5 KZ/6.0FU SOPN Inject once weekly as directed 4 pen 3  . ibuprofen (ADVIL,MOTRIN) 200 MG tablet Take 1,000 mg by mouth 2 (two) times daily as needed for headache, mild pain or moderate pain.     Marland Kitchen lisinopril-hydrochlorothiazide (ZESTORETIC) 20-12.5 MG tablet Take 2 tablets by mouth daily. 60 tablet 3  . meloxicam (MOBIC) 15 MG tablet Take 1 tablet by mouth daily 30 tablet 0  . metFORMIN (GLUCOPHAGE) 1000 MG tablet Take 1 tablet (1,000 mg total) by mouth 2 (two) times daily with a meal. 60 tablet 3  . Multiple Vitamin (MULTIVITAMIN WITH MINERALS) TABS tablet Take 1 tablet by mouth daily.    Marland Kitchen nystatin-triamcinolone ointment (MYCOLOG) Apply 1 application topically 2 (two) times daily. 60 g 0  . spironolactone (ALDACTONE) 25 MG tablet Take 1 tablet (25 mg total) by mouth every morning. 30 tablet 2  . varenicline (CHANTIX STARTING MONTH PAK) 0.5 MG X 11 & 1 MG X 42 tablet Take one 0.5 mg tablet by mouth once daily for 3 days, then increase to one 0.5 mg tablet twice daily for 4 days, then increase to one 1 mg tablet twice daily. 53 tablet 0   No current facility-administered medications for this visit.     Allergies: Patient has no known allergies.  Past Medical History:  Diagnosis Date  . Hyperlipidemia   . Hypertension   . Tobacco abuse   . Type 2 diabetes mellitus ([place])     History reviewed.  No pertinent surgical history.  Family History  Family history unknown: Yes    Social History   Tobacco Use  . Smoking status: Current Every Day Smoker    Packs/day: 0.50    Types: Cigarettes  . Smokeless tobacco: Never Used  Substance Use Topics  . Alcohol use: Yes    Comment: social    Subjective:  Patient presents for follow-up on chronic care needs. Has lost 9 pounds since OV in August 2020; taking medications as prescribed; Denies any chest pain, shortness of breath, blurred vision or headache. Notes he knows he needs to quit smoking; would be interested in trying medication.     Objective:  Vitals:   09/06/19 0905  BP: 138/84  Pulse: 90  Temp: 98.8 F (37.1 C)  TempSrc: Oral  SpO2: 97%  Weight: (!) 316 lb 3.2 oz (143.4 kg)  Height: 6' (1.829 m)    General: Well developed, well nourished, in no acute distress  Skin : Warm and dry.  Head: Normocephalic and atraumatic  Eyes: Sclera and conjunctiva clear; pupils round and reactive to light; extraocular movements intact  Ears: External normal; canals clear; tympanic membranes normal  Oropharynx: Pink, supple. No suspicious lesions  Neck: Supple without thyromegaly, adenopathy  Lungs: Respirations unlabored; clear to auscultation bilaterally without wheeze, rales, rhonchi  CVS exam: normal rate and regular rhythm.  Neurologic: Alert and oriented; speech  intact; face symmetrical; moves all extremities well; CNII-XII intact without focal deficit   Assessment:  1. Essential hypertension   2. Type 2 diabetes mellitus without complication, without long-term current use of insulin (HCC) Chronic  3. Tobacco abuse     Plan:  1. Stable; try taking Amlodipine in the evening and keep other 2 medications in the am; follow-up in 1 week- plan to check labs at next OV; 2. Stable- recent Hgba1c was at 7.2; plan to check labs at next OV. 3. Trial of Chantix; risks and benefits of medication discussed;   Return in about 1 month  (around 10/07/2019).  No orders of the defined types were placed in this encounter.   Requested Prescriptions   Signed Prescriptions Disp Refills  . varenicline (CHANTIX STARTING MONTH PAK) 0.5 MG X 11 & 1 MG X 42 tablet 53 tablet 0    Sig: Take one 0.5 mg tablet by mouth once daily for 3 days, then increase to one 0.5 mg tablet twice daily for 4 days, then increase to one 1 mg tablet twice daily.

## 2019-09-06 NOTE — Patient Instructions (Signed)
Keep the Lisinopril HCT and Spironolactone in the am; change the Amlodipine to the evening;   Steps to Quit Smoking Smoking tobacco is the leading cause of preventable death. It can affect almost every organ in the body. Smoking puts you and those around you at risk for developing many serious chronic diseases. Quitting smoking can be difficult, but it is one of the best things that you can do for your health. It is never too late to quit. How do I get ready to quit? When you decide to quit smoking, create a plan to help you succeed. Before you quit:  Pick a date to quit. Set a date within the next 2 weeks to give you time to prepare.  Write down the reasons why you are quitting. Keep this list in places where you will see it often.  Tell your family, friends, and co-workers that you are quitting. Support from your loved ones can make quitting easier.  Talk with your health care provider about your options for quitting smoking.  Find out what treatment options are covered by your health insurance.  Identify people, places, things, and activities that make you want to smoke (triggers). Avoid them. What first steps can I take to quit smoking?  Throw away all cigarettes at home, at work, and in your car.  Throw away smoking accessories, such as Scientist, research (medical).  Clean your car. Make sure to empty the ashtray.  Clean your home, including curtains and carpets. What strategies can I use to quit smoking? Talk with your health care provider about combining strategies, such as taking medicines while you are also receiving in-person counseling. Using these two strategies together makes you more likely to succeed in quitting than if you used either strategy on its own.  If you are pregnant or breastfeeding, talk with your health care provider about finding counseling or other support strategies to quit smoking. Do not take medicine to help you quit smoking unless your health care provider  tells you to do so. To quit smoking: Quit right away  Quit smoking completely, instead of gradually reducing how much you smoke over a period of time. Research shows that stopping smoking right away is more successful than gradually quitting.  Attend in-person counseling to help you build problem-solving skills. You are more likely to succeed in quitting if you attend counseling sessions regularly. Even short sessions of 10 minutes can be effective. Take medicine You may take medicines to help you quit smoking. Some medicines require a prescription and some you can purchase over-the-counter. Medicines may have nicotine in them to replace the nicotine in cigarettes. Medicines may:  Help to stop cravings.  Help to relieve withdrawal symptoms. Your health care provider may recommend:  Nicotine patches, gum, or lozenges.  Nicotine inhalers or sprays.  Non-nicotine medicine that is taken by mouth. Find resources Find resources and support systems that can help you to quit smoking and remain smoke-free after you quit. These resources are most helpful when you use them often. They include:  Online chats with a Social worker.  Telephone quitlines.  Printed Furniture conservator/restorer.  Support groups or group counseling.  Text messaging programs.  Mobile phone apps or applications. Use apps that can help you stick to your quit plan by providing reminders, tips, and encouragement. There are many free apps for mobile devices as well as websites. Examples include Quit Guide from the [employer] and smokefree.gov What things can I do to make it easier to quit?  Reach out to your family and friends for support and encouragement. Call telephone quitlines (1-800-QUIT-NOW), reach out to support groups, or work with a counselor for support.  Ask people who smoke to avoid smoking around you.  Avoid places that trigger you to smoke, such as bars, parties, or smoke-break areas at work.  Spend time with people  who do not smoke.  Lessen the stress in your life. Stress can be a smoking trigger for some people. To lessen stress, try: ? Exercising regularly. ? Doing deep-breathing exercises. ? Doing yoga. ? Meditating. ? Performing a body scan. This involves closing your eyes, scanning your body from head to toe, and noticing which parts of your body are particularly tense. Try to relax the muscles in those areas. How will I feel when I quit smoking? Day 1 to 3 weeks Within the first 24 hours of quitting smoking, you may start to feel withdrawal symptoms. These symptoms are usually most noticeable 2-3 days after quitting, but they usually do not last for more than 2-3 weeks. You may experience these symptoms:  Mood swings.  Restlessness, anxiety, or irritability.  Trouble concentrating.  Dizziness.  Strong cravings for sugary foods and nicotine.  Mild weight gain.  Constipation.  Nausea.  Coughing or a sore throat.  Changes in how the medicines that you take for unrelated issues work in your body.  Depression.  Trouble sleeping (insomnia). Week 3 and afterward After the first 2-3 weeks of quitting, you may start to notice more positive results, such as:  Improved sense of smell and taste.  Decreased coughing and sore throat.  Slower heart rate.  Lower blood pressure.  Clearer skin.  The ability to breathe more easily.  Fewer sick days. Quitting smoking can be very challenging. Do not get discouraged if you are not successful the first time. Some people need to make many attempts to quit before they achieve long-term success. Do your best to stick to your quit plan, and talk with your health care provider if you have any questions or concerns. Summary  Smoking tobacco is the leading cause of preventable death. Quitting smoking is one of the best things that you can do for your health.  When you decide to quit smoking, create a plan to help you succeed.  Quit smoking  right away, not slowly over a period of time.  When you start quitting, seek help from your health care provider, family, or friends. This information is not intended to replace advice given to you by your health care provider. Make sure you discuss any questions you have with your health care provider. Document Released: 11/03/2001 Document Revised: 01/27/2019 Document Reviewed: 01/28/2019 Elsevier Patient Education  2020 [employer].

## 2019-10-11 ENCOUNTER — Ambulatory Visit: Payer: BC Managed Care – PPO | Admitting: Family

## 2019-10-23 ENCOUNTER — Other Ambulatory Visit: Payer: Self-pay | Admitting: Family

## 2019-11-08 ENCOUNTER — Ambulatory Visit: Payer: BC Managed Care – PPO | Admitting: Family

## 2019-12-04 ENCOUNTER — Other Ambulatory Visit: Payer: Self-pay | Admitting: Family

## 2019-12-15 ENCOUNTER — Other Ambulatory Visit: Payer: Self-pay

## 2019-12-15 ENCOUNTER — Ambulatory Visit: Payer: BC Managed Care – PPO | Admitting: Family

## 2019-12-15 ENCOUNTER — Encounter: Payer: Self-pay | Admitting: Family

## 2019-12-15 ENCOUNTER — Ambulatory Visit (INDEPENDENT_AMBULATORY_CARE_PROVIDER_SITE_OTHER): Payer: BC Managed Care – PPO

## 2019-12-15 VITALS — BP 148/90 | HR 97 | Temp 98.2°F | Ht 72.0 in | Wt 308.0 lb

## 2019-12-15 DIAGNOSIS — G8929 Other chronic pain: Secondary | ICD-10-CM | POA: Diagnosis not present

## 2019-12-15 DIAGNOSIS — M545 Low back pain: Secondary | ICD-10-CM

## 2019-12-15 DIAGNOSIS — E119 Type 2 diabetes mellitus without complications: Secondary | ICD-10-CM | POA: Diagnosis not present

## 2019-12-15 LAB — COMPREHENSIVE METABOLIC PANEL
ALT: 22 U/L (ref 0–53)
AST: 18 U/L (ref 0–37)
Albumin: 5 g/dL (ref 3.5–5.2)
Alkaline Phosphatase: 61 U/L (ref 39–117)
BUN: 16 mg/dL (ref 6–23)
CO2: 27 mEq/L (ref 19–32)
Calcium: 10.4 mg/dL (ref 8.4–10.5)
Chloride: 98 mEq/L (ref 96–112)
Creatinine, Ser: 0.98 mg/dL (ref 0.40–1.50)
GFR: 103.07 mL/min (ref >60.00)
Glucose, Bld: 133 mg/dL — ABNORMAL HIGH (ref 70–99)
Potassium: 3.7 mEq/L (ref 3.5–5.1)
Sodium: 137 mEq/L (ref 135–145)
Total Bilirubin: 1.1 mg/dL (ref 0.2–1.2)
Total Protein: 8.3 g/dL (ref 6.0–8.3)

## 2019-12-15 LAB — HEMOGLOBIN A1C: Hgb A1c MFr Bld: 7.2 % — ABNORMAL HIGH (ref 4.6–6.5)

## 2019-12-15 MED ORDER — METHOCARBAMOL 500 MG PO TABS: 500.0000 mg | ORAL_TABLET | Freq: Three times a day (TID) | ORAL | 0 refills | Status: AC | PRN

## 2019-12-15 MED ORDER — MELOXICAM 15 MG PO TABS: 15.0000 mg | ORAL_TABLET | Freq: Every day | ORAL | 2 refills | Status: AC

## 2019-12-15 MED ORDER — TRAMADOL HCL 50 MG PO TABS: 50.0000 mg | ORAL_TABLET | Freq: Three times a day (TID) | ORAL | 0 refills | Status: AC | PRN

## 2019-12-15 NOTE — Progress Notes (Signed)
Craig Rowland is a 39 y.o. male with the following history as recorded in EpicCare:  Patient Active Problem List   Diagnosis Date Noted  . Type 2 diabetes mellitus without complication, without long-term current use of insulin ([place]) 01/27/2019  . Chronic low back pain 01/27/2019  . Essential hypertension 12/27/2018    Current Outpatient Medications  Medication Sig Dispense Refill  . amLODipine (NORVASC) 10 MG tablet Take 1 tablet by mouth once daily 30 tablet 5  . atorvastatin (LIPITOR) 20 MG tablet Take 1 tablet by mouth daily 30 tablet 5  . ibuprofen (ADVIL,MOTRIN) 200 MG tablet Take 1,000 mg by mouth 2 (two) times daily as needed for headache, mild pain or moderate pain.     Marland Kitchen lisinopril-hydrochlorothiazide (ZESTORETIC) 20-12.5 MG tablet Take 2 tablets by mouth once daily 60 tablet 0  . meloxicam (MOBIC) 15 MG tablet Take 1 tablet (15 mg total) by mouth daily. 30 tablet 2  . metFORMIN (GLUCOPHAGE) 1000 MG tablet TAKE 1 TABLET BY MOUTH TWICE DAILY WITH A MEAL 60 tablet 0  . Multiple Vitamin (MULTIVITAMIN WITH MINERALS) TABS tablet Take 1 tablet by mouth daily.    Marland Kitchen nystatin-triamcinolone ointment (MYCOLOG) Apply 1 application topically 2 (two) times daily. 60 g 0  . spironolactone (ALDACTONE) 25 MG tablet TAKE 1 TABLET BY MOUTH ONCE DAILY IN THE MORNING 30 tablet 0  . TRULICITY 1.5 GE/3.6OQ SOPN INJECT ONCE WEEKLY AS DIRECTED 4 mL 5  . varenicline (CHANTIX STARTING MONTH PAK) 0.5 MG X 11 & 1 MG X 42 tablet Take one 0.5 mg tablet by mouth once daily for 3 days, then increase to one 0.5 mg tablet twice daily for 4 days, then increase to one 1 mg tablet twice daily. 53 tablet 0  . methocarbamol (ROBAXIN) 500 MG tablet Take 1 tablet (500 mg total) by mouth every 8 (eight) hours as needed. 30 tablet 0  . traMADol (ULTRAM) 50 MG tablet Take 1 tablet (50 mg total) by mouth every 8 (eight) hours as needed for up to 5 days for moderate pain. 15 tablet 0   No current facility-administered  medications for this visit.    Allergies: Patient has no known allergies.  Past Medical History:  Diagnosis Date  . Hyperlipidemia   . Hypertension   . Tobacco abuse   . Type 2 diabetes mellitus ([place])     History reviewed. No pertinent surgical history.  Family History  Family history unknown: Yes    Social History   Tobacco Use  . Smoking status: Current Every Day Smoker    Packs/day: 0.50    Types: Cigarettes  . Smokeless tobacco: Never Used  Substance Use Topics  . Alcohol use: Yes    Comment: social    Subjective:  Known history of low back pain; seemed worse in the past week; no recent injury; MRI was discussed in the past year but patient declined due to cost. Has been using Mobic with limited benefit; denies any numbness or tingling into his fingertips;     Objective:  Vitals:   12/15/19 1527  BP: (!) 148/90  Pulse: 97  Temp: 98.2 F (36.8 C)  TempSrc: Oral  SpO2: 98%  Weight: (!) 308 lb (139.7 kg)  Height: 6' (1.829 m)    General: Well developed, well nourished, in no acute distress  Skin : Warm and dry.  Head: Normocephalic and atraumatic  Lungs: Respirations unlabored; clear to auscultation bilaterally without wheeze, rales, rhonchi  CVS exam: normal rate and regular  rhythm.  Musculoskeletal: No deformities; no active joint inflammation  Extremities: No edema, cyanosis, clubbing  Vessels: Symmetric bilaterally  Neurologic: Alert and oriented; speech intact; face symmetrical; moves all extremities well; CNII-XII intact without focal deficit   Assessment:  1. Chronic right-sided low back pain without sciatica   2. Type 2 diabetes mellitus without complication, without long-term current use of insulin ([place])     Plan:  1. Update lumbar X-ray; add Robaxin and Toradol; may need to consider MRI; follow-up to be determined; 2. Update labs today;   This visit occurred during the SARS-CoV-2 public health emergency.  Safety protocols were in place,  including screening questions prior to the visit, additional usage of staff PPE, and extensive cleaning of exam room while observing appropriate contact time as indicated for disinfecting solutions.  ' No follow-ups on file.  Orders Placed This Encounter  Procedures  . DG Lumbar Spine Complete    Order Specific Question:   Reason for Exam (SYMPTOM  OR DIAGNOSIS REQUIRED)    Answer:   low back pain    Order Specific Question:   Preferred imaging location?    Answer:   [facility]    Order Specific Question:   Radiology Contrast Protocol - do NOT remove file path    Answer:   [email or web address]  . Comp Met (CMET)  . HgB A1c    Requested Prescriptions   Signed Prescriptions Disp Refills  . methocarbamol (ROBAXIN) 500 MG tablet 30 tablet 0    Sig: Take 1 tablet (500 mg total) by mouth every 8 (eight) hours as needed.  . meloxicam (MOBIC) 15 MG tablet 30 tablet 2    Sig: Take 1 tablet (15 mg total) by mouth daily.  . traMADol (ULTRAM) 50 MG tablet 15 tablet 0    Sig: Take 1 tablet (50 mg total) by mouth every 8 (eight) hours as needed for up to 5 days for moderate pain.

## 2019-12-21 ENCOUNTER — Telehealth: Payer: Self-pay

## 2019-12-21 NOTE — Telephone Encounter (Signed)
New message    Vikki Ports wife calling for test results.

## 2019-12-21 NOTE — Telephone Encounter (Signed)
Tried reaching patient again on his cell and wife's number. Phone rings at first, no VM and then message comes on that call can not be completed as dialed as if number is no longer working. Tried number x 3 times and still same thing.  If patient returns calls please let him know his HbA1c is stable. As far as his back goes, he can either to the MRI for further work up or he can see sports med in follow up for possible injections as the xray did note mild arthritis changes.

## 2019-12-22 NOTE — Telephone Encounter (Signed)
   Please call patient at [phone] to discuss lab results

## 2019-12-22 NOTE — Telephone Encounter (Signed)
Message left today for patient with results.

## 2020-01-01 NOTE — Progress Notes (Signed)
[place] Phone: [phone] Subjective:   Craig Rowland, am serving as a scribe for Dr. Hulan Saas. This visit occurred during the SARS-CoV-2 public health emergency.  Safety protocols were in place, including screening questions prior to the visit, additional usage of staff PPE, and extensive cleaning of exam room while observing appropriate contact time as indicated for disinfecting solutions.   I'm seeing this patient by the request  of:  Marrian Salvage, FNP  CC: Low back pain  AJO:INOMVEHMCN  Craig Rowland is a 39 y.o. male coming in with complaint of back and right hip pain. Patient states that his pain is constant. Denies any radiating symptoms. Only time he has relief is when he is lying down in bed. Pain is in right glute. Has used Biofreeze for pain as well as Motrin.    IMPRESSION: Rowland acute osseous abnormality. Minimal disc space narrowing at L5-S1. Past Medical History:  Diagnosis Date  . Hyperlipidemia   . Hypertension   . Tobacco abuse   . Type 2 diabetes mellitus (HCC)    Rowland past surgical history on file. Social History   Socioeconomic History  . Marital status: Single    Spouse name: Not on file  . Number of children: Not on file  . Years of education: Not on file  . Highest education level: Not on file  Occupational History  . Not on file  Tobacco Use  . Smoking status: Current Every Day Smoker    Packs/day: 0.50    Types: Cigarettes  . Smokeless tobacco: Never Used  Substance and Sexual Activity  . Alcohol use: Yes    Comment: social  . Drug use: Yes    Frequency: 7.0 times per week    Types: Marijuana  . Sexual activity: Not on file  Other Topics Concern  . Not on file  Social History Narrative  . Not on file   Social Determinants of Health   Financial Resource Strain:   . Difficulty of Paying Living Expenses: Not on file  Food Insecurity:   . Worried  About Charity fundraiser in the Last Year: Not on file  . Ran Out of Food in the Last Year: Not on file  Transportation Needs:   . Lack of Transportation (Medical): Not on file  . Lack of Transportation (Non-Medical): Not on file  Physical Activity:   . Days of Exercise per Week: Not on file  . Minutes of Exercise per Session: Not on file  Stress:   . Feeling of Stress : Not on file  Social Connections:   . Frequency of Communication with Friends and Family: Not on file  . Frequency of Social Gatherings with Friends and Family: Not on file  . Attends Religious Services: Not on file  . Active Member of Clubs or Organizations: Not on file  . Attends Archivist Meetings: Not on file  . Marital Status: Not on file   Rowland Known Allergies Family History  Family history unknown: Yes    Current Outpatient Medications (Endocrine & Metabolic):  .  metFORMIN (GLUCOPHAGE) 1000 MG tablet, TAKE 1 TABLET BY MOUTH TWICE DAILY WITH A MEAL .  TRULICITY 1.5 OB/0.9GG SOPN, INJECT ONCE WEEKLY AS DIRECTED  Current Outpatient Medications (Cardiovascular):  .  amLODipine (NORVASC) 10 MG tablet, Take 1 tablet by mouth once daily .  atorvastatin (LIPITOR) 20 MG tablet, Take 1 tablet by mouth daily .  lisinopril-hydrochlorothiazide (ZESTORETIC) 20-12.5 MG tablet, Take 2 tablets by mouth once daily .  spironolactone (ALDACTONE) 25 MG tablet, TAKE 1 TABLET BY MOUTH ONCE DAILY IN THE MORNING   Current Outpatient Medications (Analgesics):  .  ibuprofen (ADVIL,MOTRIN) 200 MG tablet, Take 1,000 mg by mouth 2 (two) times daily as needed for headache, mild pain or moderate pain.  .  meloxicam (MOBIC) 15 MG tablet, Take 1 tablet (15 mg total) by mouth daily.   Current Outpatient Medications (Other):  .  methocarbamol (ROBAXIN) 500 MG tablet, Take 1 tablet (500 mg total) by mouth every 8 (eight) hours as needed. .  Multiple Vitamin (MULTIVITAMIN WITH MINERALS) TABS tablet, Take 1 tablet by mouth  daily. Marland Kitchen  nystatin-triamcinolone ointment (MYCOLOG), Apply 1 application topically 2 (two) times daily. .  varenicline (CHANTIX STARTING MONTH PAK) 0.5 MG X 11 & 1 MG X 42 tablet, Take one 0.5 mg tablet by mouth once daily for 3 days, then increase to one 0.5 mg tablet twice daily for 4 days, then increase to one 1 mg tablet twice daily. Marland Kitchen  gabapentin (NEURONTIN) 100 MG capsule, Take 2 capsules (200 mg total) by mouth at bedtime.   Reviewed prior external information including notes and imaging from  primary care provider As well as notes that were available from care everywhere and other healthcare systems.  Past medical history, social, surgical and family history all reviewed in electronic medical record.  Rowland pertanent information unless stated regarding to the chief complaint.   Review of Systems:  Rowland headache, visual changes, nausea, vomiting, diarrhea, constipation, dizziness, abdominal pain, skin rash, fevers, chills, night sweats, weight loss, swollen lymph nodes, body aches, joint swelling, chest pain, shortness of breath, mood changes. POSITIVE muscle aches  Objective  Blood pressure 120/84, pulse 86, height 6' (1.829 m), weight (!) 309 lb (140.2 kg), SpO2 99 %.   General: Rowland apparent distress alert and oriented x3 mood and affect normal, dressed appropriately.  Poor core strength. HEENT: Pupils equal, extraocular movements intact  Respiratory: Patient's speak in full sentences and does not appear short of breath  Cardiovascular: Rowland lower extremity edema, non tender, Rowland erythema  Skin: Warm dry intact with Rowland signs of infection or rash on extremities or on axial skeleton.  Abdomen: Soft nontender  Neuro: Cranial nerves II through XII are intact, neurovascularly intact in all extremities with 2+ DTRs and 2+ pulses.  Lymph: Rowland lymphadenopathy of posterior or anterior cervical chain or axillae bilaterally.  Gait normal with good balance and coordination.  MSK:  Non tender with full  range of motion and good stability and symmetric strength and tone of shoulders, elbows, wrist, , knee and ankles bilaterally.  Severely antalgic patient is favoring the right side of his back.  Severe positive piriformis pain with Pearlean Brownie test.  Negative straight leg test.  Nontender on the exam in the paraspinal musculature lumbar spine.  5 5 strength in lower extremity deep tendon reflexes intact.  [id number]; 15 additional minutes spent for Therapeutic exercises as stated in above notes.  This included exercises focusing on stretching, strengthening, with significant focus on eccentric aspects.   Long term goals include an improvement in range of motion, strength, endurance as well as avoiding reinjury. Patient's frequency would include in 1-2 times a day, 3-5 times a week for a duration of 6-12 weeks.  Using Netter's Orthopaedic Anatomy, reviewed with the patient the structures involved and how they related to diagnosis. The patient indicated understanding.   The patient  was given a handout about classic piriformis stretching including [facility], Modified [facility], my self-described "Sink Stretch," and other piriformis rehab.  We also reviewed hip flexor and abductor strengthening, ham stretching  Rec deep massage, explained self-massage with ball  Proper technique shown and discussed handout in great detail with ATC.  All questions were discussed and answered.     Impression and Recommendations:     This case required medical decision making of moderate complexity. The above documentation has been reviewed and is accurate and complete Judi Saa, DO       Note: This dictation was prepared with Dragon dictation along with smaller phrase technology. Any transcriptional errors that result from this process are unintentional.

## 2020-01-02 ENCOUNTER — Other Ambulatory Visit: Payer: Self-pay

## 2020-01-02 ENCOUNTER — Encounter: Payer: Self-pay | Admitting: Family Medicine

## 2020-01-02 ENCOUNTER — Ambulatory Visit: Payer: BC Managed Care – PPO | Admitting: Family Medicine

## 2020-01-02 VITALS — BP 120/84 | HR 86 | Ht 72.0 in | Wt 309.0 lb

## 2020-01-02 DIAGNOSIS — G5701 Lesion of sciatic nerve, right lower limb: Secondary | ICD-10-CM

## 2020-01-02 DIAGNOSIS — M2550 Pain in unspecified joint: Secondary | ICD-10-CM | POA: Diagnosis not present

## 2020-01-02 MED ORDER — KETOROLAC TROMETHAMINE 60 MG/2ML IM SOLN
60.0000 mg | Freq: Once | INTRAMUSCULAR | Status: AC
  Administered 2020-01-02: 15:00:00 60 mg via INTRAMUSCULAR

## 2020-01-02 MED ORDER — GABAPENTIN 100 MG PO CAPS: 200.0000 mg | ORAL_CAPSULE | Freq: Every day | ORAL | 3 refills | Status: AC

## 2020-01-02 MED ORDER — METHYLPREDNISOLONE ACETATE 80 MG/ML IJ SUSP
80.0000 mg | Freq: Once | INTRAMUSCULAR | Status: AC
  Administered 2020-01-02: 80 mg via INTRAMUSCULAR

## 2020-01-02 NOTE — Assessment & Plan Note (Signed)
Piriformis syndrome.  Discussed which activities to do which wants to avoid. Discussed icing regimen and home exercise, discussed topical anti-inflammatories.  Discussed weight loss, discussed different ergonomic changes with him driving a significant amount of time.  Patient does have some degenerative disc disease in the lumbar spine but I do not have present any signs that would be corresponding to the radicular symptoms at the moment but did give gabapentin to take at night.  Toradol Depo-Medrol given secondary to the severity of the pain.  Worsening symptoms we will consider the possibility of piriformis injection at follow-up and formal physical therapy.

## 2020-01-02 NOTE — Patient Instructions (Addendum)
Good to see you.  Ice 20 minutes 2 times daily. Usually after activity and before bed. Tennis ball in back right pocket with sitting Exercises 3 times a week.  Gabapentin 200mg  at night  2 injections today  [employer] orthotics "total support" online would be great  See me again in 4 weeks and if not better we will try an injection

## 2020-01-15 ENCOUNTER — Other Ambulatory Visit: Payer: Self-pay | Admitting: Family

## 2020-01-16 ENCOUNTER — Other Ambulatory Visit: Payer: Self-pay | Admitting: Family

## 2020-01-16 ENCOUNTER — Telehealth: Payer: Self-pay | Admitting: Family

## 2020-01-16 MED ORDER — ATORVASTATIN CALCIUM 20 MG PO TABS: 20.0000 mg | ORAL_TABLET | Freq: Every day | ORAL | 1 refills | Status: AC

## 2020-01-16 MED ORDER — SPIRONOLACTONE 25 MG PO TABS: 25.0000 mg | ORAL_TABLET | Freq: Every day | ORAL | 1 refills | Status: AC

## 2020-01-16 MED ORDER — METFORMIN HCL 1000 MG PO TABS: 1000.0000 mg | ORAL_TABLET | Freq: Two times a day (BID) | ORAL | 1 refills | Status: AC

## 2020-01-16 MED ORDER — LISINOPRIL-HYDROCHLOROTHIAZIDE 20-12.5 MG PO TABS: 2.0000 | ORAL_TABLET | Freq: Every day | ORAL | 1 refills | Status: AC

## 2020-01-16 MED ORDER — AMLODIPINE BESYLATE 10 MG PO TABS: 10.0000 mg | ORAL_TABLET | Freq: Every day | ORAL | 1 refills | Status: AC

## 2020-01-16 NOTE — Telephone Encounter (Signed)
   Spouse calling [phone] Patient will be losing insurance in March. Requesting 90 day supply of meds     1. Which medications need to be refilled? (please list name of each medication and dose if known)  amLODipine (NORVASC) 10 MG tablet atorvastatin (LIPITOR) 20 MG tablet lisinopril-hydrochlorothiazide (ZESTORETIC) 20-12.5 MG tablet spironolactone (ALDACTONE) 25 MG tablet metFORMIN (GLUCOPHAGE) 1000 MG tablet   2. Which pharmacy/location (including street and city if local pharmacy) is medication to be sent to? [id number] - [place] ([place]), [place] - [address]  3. Do they need a 30 day or 90 day supply? 90

## 2020-09-29 IMAGING — DX DG LUMBAR SPINE COMPLETE 4+V
5 series · 5 of 5 positions shown · non-contrast
Comparison: 12/27/2018

CLINICAL DATA: Low back pain

EXAM:
LUMBAR SPINE - COMPLETE 4+ VIEW

[lumbar spine ap]
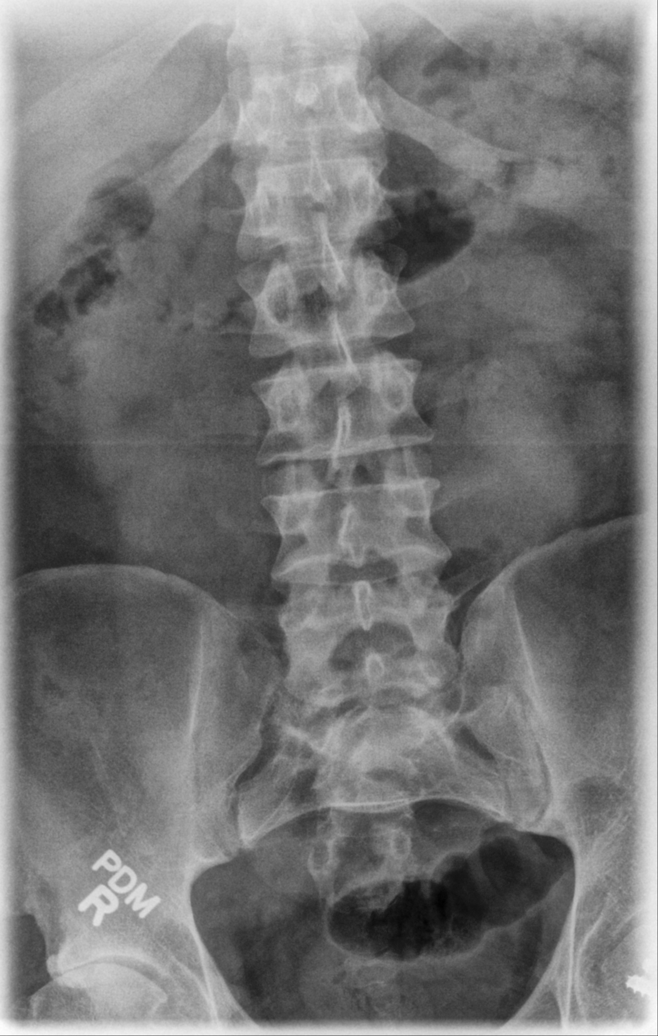

[lumbar spine mlo (1 of 2)]
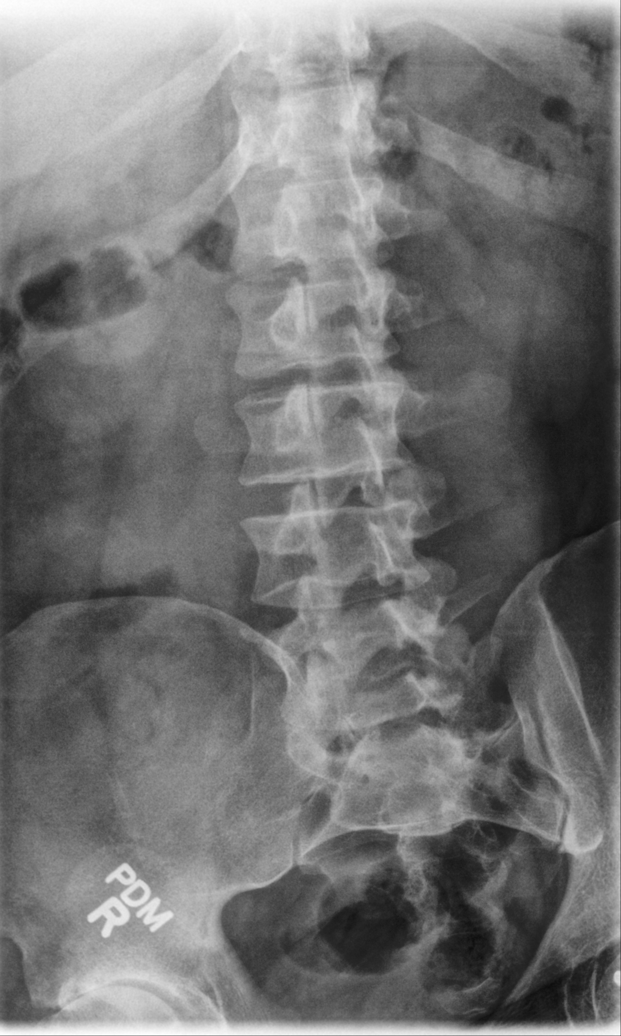

[lumbar spine mlo (2 of 2)]
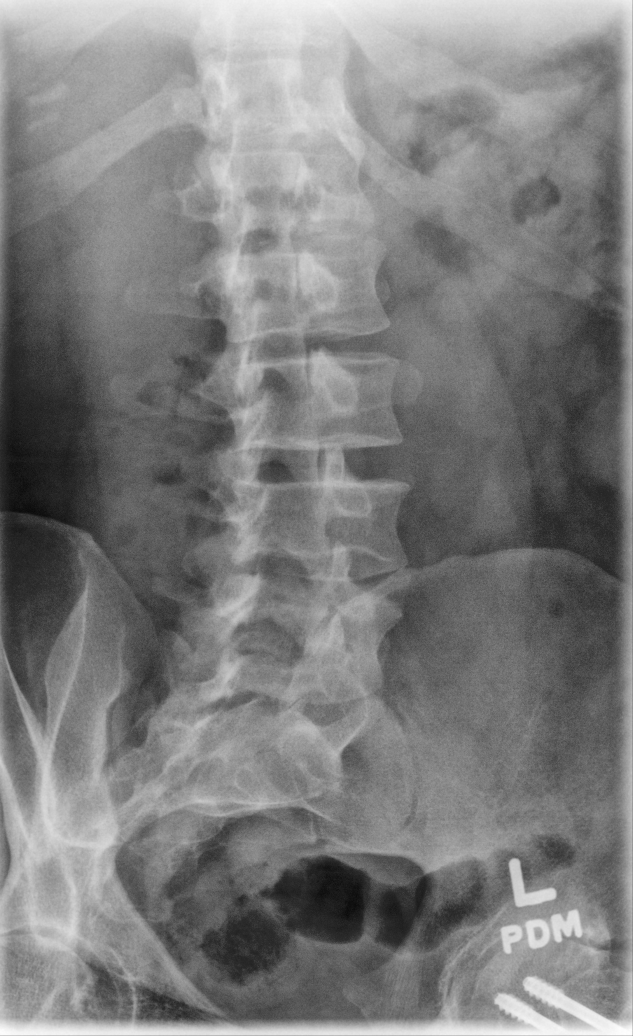

[lumbar spine lat (1 of 2)]
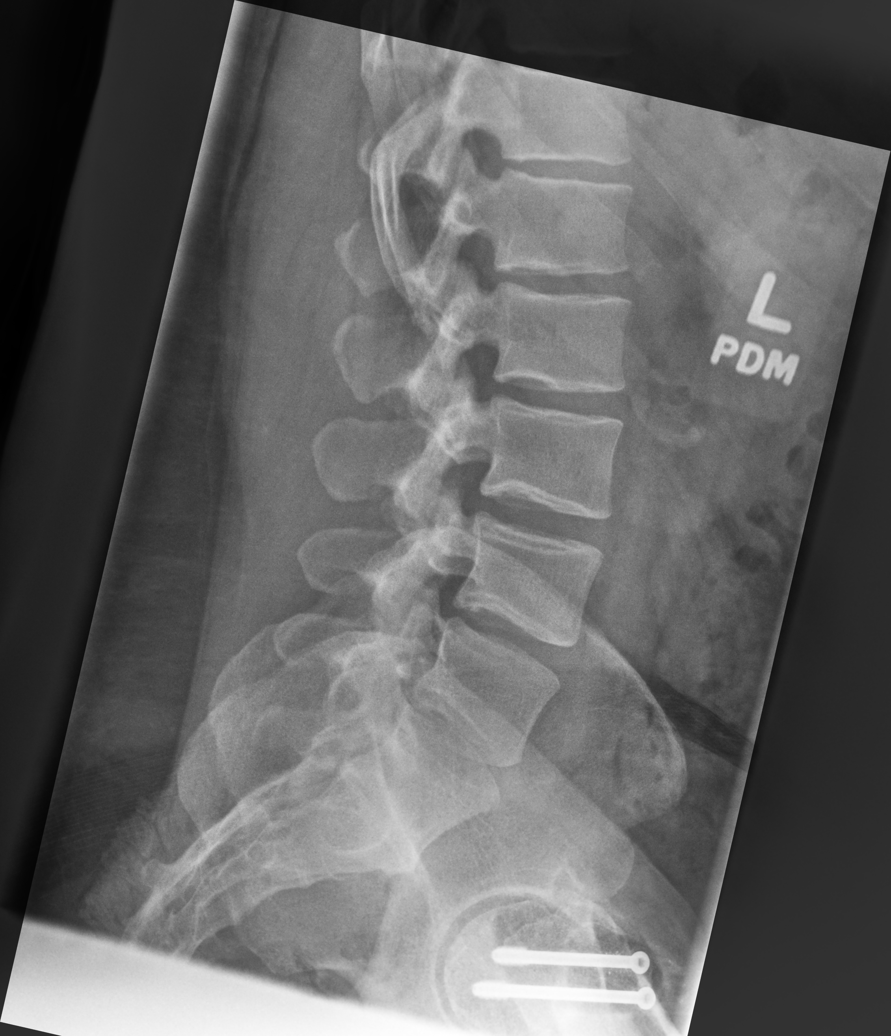

[lumbar spine lat (2 of 2)]
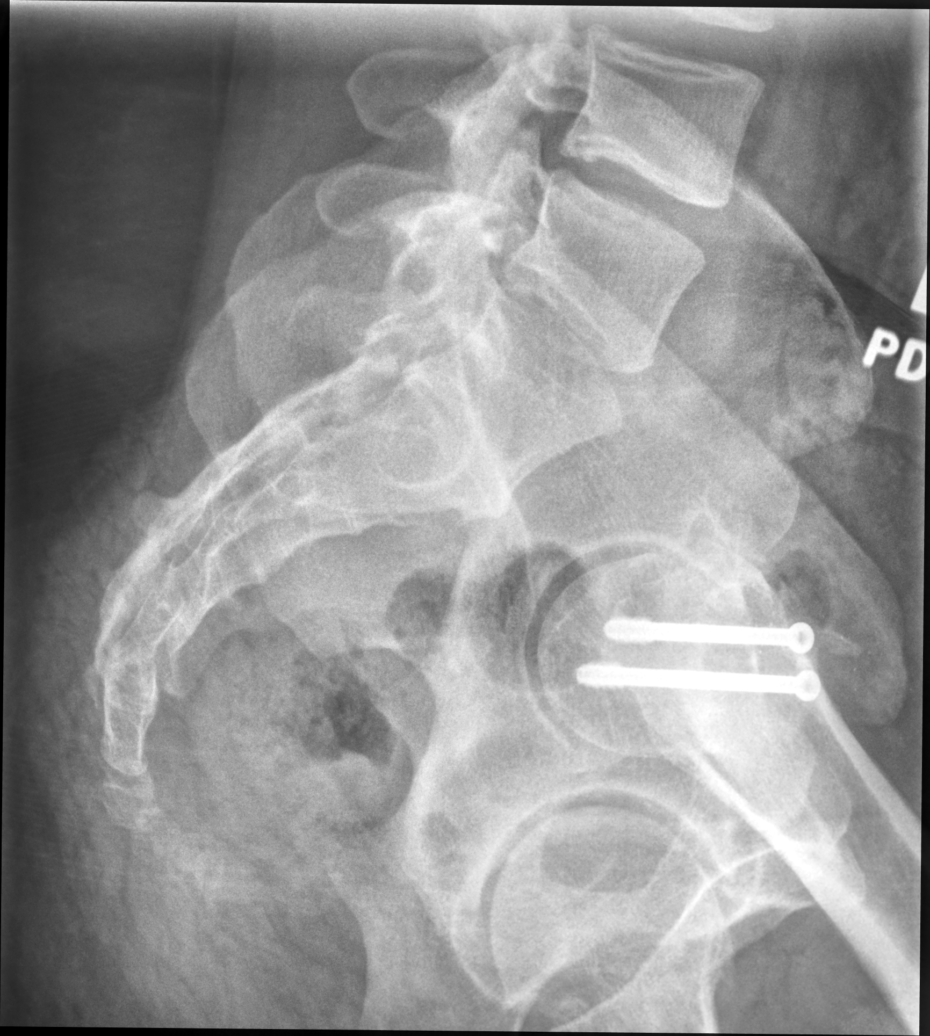

[5 of 5 positions shown; findings below may reference images not displayed]

FINDINGS: Lumbar alignment normal. Vertebral body heights are maintained.
Minimal disc space narrowing L5-S1. Surgical screws in the proximal
left femur.
IMPRESSION: No acute osseous abnormality. Minimal disc space narrowing at L5-S1.
